# Patient Record
Sex: Female | Born: 1990
Health system: Southern US, Community
[De-identification: ages and names within clinical notes are randomized; demographics above are authoritative.]

## PROBLEM LIST (undated history)

## (undated) DIAGNOSIS — B2790 Infectious mononucleosis, unspecified without complication: Secondary | ICD-10-CM

## (undated) DIAGNOSIS — R7303 Prediabetes: Secondary | ICD-10-CM

## (undated) DIAGNOSIS — I10 Essential (primary) hypertension: Secondary | ICD-10-CM

## (undated) DIAGNOSIS — E119 Type 2 diabetes mellitus without complications: Secondary | ICD-10-CM

## (undated) DIAGNOSIS — F419 Anxiety disorder, unspecified: Secondary | ICD-10-CM

## (undated) DIAGNOSIS — E785 Hyperlipidemia, unspecified: Secondary | ICD-10-CM

## (undated) DIAGNOSIS — Z9889 Other specified postprocedural states: Secondary | ICD-10-CM

## (undated) HISTORY — DX: Type 2 diabetes mellitus without complications: E11.9

## (undated) HISTORY — DX: Essential (primary) hypertension: I10

## (undated) HISTORY — PX: CYSTECTOMY: SUR359

## (undated) HISTORY — PX: SALPINGECTOMY: SHX328

---
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
## 2014-07-11 ENCOUNTER — Encounter: Payer: Self-pay | Admitting: Emergency Medicine

## 2014-07-11 ENCOUNTER — Observation Stay
Admission: EM | Admit: 2014-07-11 | Discharge: 2014-07-14 | Disposition: A | Discharge status: Home / Self Care | Payer: BLUE CROSS/BLUE SHIELD | Attending: Internal Medicine | Admitting: Internal Medicine

## 2014-07-11 ENCOUNTER — Emergency Department: Payer: BLUE CROSS/BLUE SHIELD

## 2014-07-11 DIAGNOSIS — K760 Fatty (change of) liver, not elsewhere classified: Secondary | ICD-10-CM | POA: Diagnosis not present

## 2014-07-11 DIAGNOSIS — K920 Hematemesis: Secondary | ICD-10-CM | POA: Diagnosis present

## 2014-07-11 DIAGNOSIS — B3781 Candidal esophagitis: Secondary | ICD-10-CM | POA: Insufficient documentation

## 2014-07-11 DIAGNOSIS — K209 Esophagitis, unspecified: Secondary | ICD-10-CM | POA: Insufficient documentation

## 2014-07-11 DIAGNOSIS — R1084 Generalized abdominal pain: Secondary | ICD-10-CM | POA: Diagnosis not present

## 2014-07-11 DIAGNOSIS — Z794 Long term (current) use of insulin: Secondary | ICD-10-CM | POA: Insufficient documentation

## 2014-07-11 DIAGNOSIS — R350 Frequency of micturition: Secondary | ICD-10-CM | POA: Diagnosis not present

## 2014-07-11 DIAGNOSIS — Z79899 Other long term (current) drug therapy: Secondary | ICD-10-CM | POA: Diagnosis not present

## 2014-07-11 DIAGNOSIS — F419 Anxiety disorder, unspecified: Secondary | ICD-10-CM | POA: Diagnosis not present

## 2014-07-11 DIAGNOSIS — R102 Pelvic and perineal pain: Secondary | ICD-10-CM | POA: Insufficient documentation

## 2014-07-11 DIAGNOSIS — Z881 Allergy status to other antibiotic agents status: Secondary | ICD-10-CM | POA: Diagnosis not present

## 2014-07-11 DIAGNOSIS — Z91040 Latex allergy status: Secondary | ICD-10-CM | POA: Diagnosis not present

## 2014-07-11 DIAGNOSIS — Z880 Allergy status to penicillin: Secondary | ICD-10-CM | POA: Insufficient documentation

## 2014-07-11 DIAGNOSIS — R112 Nausea with vomiting, unspecified: Secondary | ICD-10-CM | POA: Diagnosis present

## 2014-07-11 DIAGNOSIS — K2970 Gastritis, unspecified, without bleeding: Secondary | ICD-10-CM | POA: Diagnosis not present

## 2014-07-11 DIAGNOSIS — R7303 Prediabetes: Secondary | ICD-10-CM | POA: Diagnosis present

## 2014-07-11 DIAGNOSIS — R7309 Other abnormal glucose: Secondary | ICD-10-CM | POA: Insufficient documentation

## 2014-07-11 DIAGNOSIS — R1110 Vomiting, unspecified: Secondary | ICD-10-CM

## 2014-07-11 HISTORY — DX: Anxiety disorder, unspecified: F41.9

## 2014-07-11 HISTORY — DX: Prediabetes: R73.03

## 2014-07-11 LAB — CBC WITH DIFFERENTIAL/PLATELET
BASOS PCT: 1 %
Basophils Absolute: 0.1 10*3/uL (ref 0–0.1)
EOS ABS: 0.1 10*3/uL (ref 0–0.7)
EOS PCT: 1 %
HCT: 40.9 % (ref 35.0–47.0)
Hemoglobin: 13.5 g/dL (ref 12.0–16.0)
Lymphocytes Relative: 23 %
Lymphs Abs: 2.6 10*3/uL (ref 1.0–3.6)
MCH: 29.3 pg (ref 26.0–34.0)
MCHC: 33 g/dL (ref 32.0–36.0)
MCV: 88.9 fL (ref 80.0–100.0)
MONOS PCT: 6 %
Monocytes Absolute: 0.6 10*3/uL (ref 0.2–0.9)
Neutro Abs: 7.6 10*3/uL — ABNORMAL HIGH (ref 1.4–6.5)
Neutrophils Relative %: 69 %
Platelets: 275 10*3/uL (ref 150–440)
RBC: 4.6 MIL/uL (ref 3.80–5.20)
RDW: 13.2 % (ref 11.5–14.5)
WBC: 11 10*3/uL (ref 3.6–11.0)

## 2014-07-11 LAB — COMPREHENSIVE METABOLIC PANEL
ALK PHOS: 83 U/L (ref 38–126)
ALT: 56 U/L — ABNORMAL HIGH (ref 14–54)
ANION GAP: 10 (ref 5–15)
AST: 58 U/L — ABNORMAL HIGH (ref 15–41)
Albumin: 4.2 g/dL (ref 3.5–5.0)
BUN: 9 mg/dL (ref 6–20)
CHLORIDE: 99 mmol/L — AB (ref 101–111)
CO2: 27 mmol/L (ref 22–32)
CREATININE: 0.53 mg/dL (ref 0.44–1.00)
Calcium: 9.3 mg/dL (ref 8.9–10.3)
Glucose, Bld: 140 mg/dL — ABNORMAL HIGH (ref 65–99)
POTASSIUM: 3.6 mmol/L (ref 3.5–5.1)
SODIUM: 136 mmol/L (ref 135–145)
Total Bilirubin: 0.5 mg/dL (ref 0.3–1.2)
Total Protein: 8.7 g/dL — ABNORMAL HIGH (ref 6.5–8.1)

## 2014-07-11 LAB — PREGNANCY, URINE: Preg Test, Ur: NEGATIVE

## 2014-07-11 MED ORDER — SODIUM CHLORIDE 0.9 % IV SOLN
Freq: Once | INTRAVENOUS | Status: AC
  Administered 2014-07-11: 18:00:00 via INTRAVENOUS

## 2014-07-11 MED ORDER — HYDROMORPHONE HCL 1 MG/ML IJ SOLN
INTRAMUSCULAR | Status: AC
  Administered 2014-07-11: 1 mg via INTRAVENOUS
  Filled 2014-07-11: qty 1

## 2014-07-11 MED ORDER — MORPHINE SULFATE 4 MG/ML IJ SOLN
4.0000 mg | Freq: Once | INTRAMUSCULAR | Status: AC
  Administered 2014-07-11: 4 mg via INTRAVENOUS

## 2014-07-11 MED ORDER — FAMOTIDINE IN NACL 20-0.9 MG/50ML-% IV SOLN
20.0000 mg | Freq: Once | INTRAVENOUS | Status: AC
  Administered 2014-07-11: 20 mg via INTRAVENOUS
  Filled 2014-07-11: qty 50

## 2014-07-11 MED ORDER — INSULIN ASPART 100 UNIT/ML ~~LOC~~ SOLN
0.0000 [IU] | Freq: Three times a day (TID) | SUBCUTANEOUS | Status: DC
  Administered 2014-07-12 (×2): 1 [IU] via SUBCUTANEOUS
  Administered 2014-07-12: 3 [IU] via SUBCUTANEOUS
  Filled 2014-07-11 (×2): qty 1
  Filled 2014-07-11: qty 3

## 2014-07-11 MED ORDER — ONDANSETRON HCL 4 MG/2ML IJ SOLN
4.0000 mg | Freq: Once | INTRAMUSCULAR | Status: AC
  Administered 2014-07-11: 4 mg via INTRAVENOUS

## 2014-07-11 MED ORDER — SERTRALINE HCL 50 MG PO TABS
50.0000 mg | ORAL_TABLET | Freq: Every day | ORAL | Status: DC
  Administered 2014-07-13 – 2014-07-14 (×2): 50 mg via ORAL
  Filled 2014-07-11 (×2): qty 1

## 2014-07-11 MED ORDER — METOCLOPRAMIDE HCL 5 MG/ML IJ SOLN
10.0000 mg | Freq: Once | INTRAMUSCULAR | Status: AC
  Administered 2014-07-11: 10 mg via INTRAVENOUS

## 2014-07-11 MED ORDER — ONDANSETRON HCL 4 MG PO TABS
4.0000 mg | ORAL_TABLET | Freq: Four times a day (QID) | ORAL | Status: DC | PRN
  Filled 2014-07-11: qty 1

## 2014-07-11 MED ORDER — ENOXAPARIN SODIUM 40 MG/0.4ML ~~LOC~~ SOLN
40.0000 mg | SUBCUTANEOUS | Status: DC
Start: 2014-07-11 — End: 2014-07-12
  Administered 2014-07-12: 40 mg via SUBCUTANEOUS
  Filled 2014-07-11: qty 0.4

## 2014-07-11 MED ORDER — MORPHINE SULFATE 4 MG/ML IJ SOLN
INTRAMUSCULAR | Status: AC
  Filled 2014-07-11: qty 1

## 2014-07-11 MED ORDER — MORPHINE SULFATE 2 MG/ML IJ SOLN
2.0000 mg | INTRAMUSCULAR | Status: DC | PRN
  Administered 2014-07-12 – 2014-07-13 (×3): 2 mg via INTRAVENOUS
  Filled 2014-07-11 (×3): qty 1

## 2014-07-11 MED ORDER — ONDANSETRON HCL 4 MG/2ML IJ SOLN
INTRAMUSCULAR | Status: AC
  Filled 2014-07-11: qty 2

## 2014-07-11 MED ORDER — FAMOTIDINE IN NACL 20-0.9 MG/50ML-% IV SOLN
INTRAVENOUS | Status: AC
  Filled 2014-07-11: qty 50

## 2014-07-11 MED ORDER — PROMETHAZINE HCL 25 MG/ML IJ SOLN: 12.5000 mg | Freq: Four times a day (QID) | INTRAMUSCULAR | Status: DC | PRN

## 2014-07-11 MED ORDER — IOHEXOL 300 MG/ML  SOLN
100.0000 mL | Freq: Once | INTRAMUSCULAR | Status: AC | PRN
  Administered 2014-07-11: 100 mL via INTRAVENOUS

## 2014-07-11 MED ORDER — ONDANSETRON HCL 4 MG/2ML IJ SOLN
4.0000 mg | Freq: Four times a day (QID) | INTRAMUSCULAR | Status: DC | PRN
  Administered 2014-07-12 – 2014-07-13 (×4): 4 mg via INTRAVENOUS
  Filled 2014-07-11 (×5): qty 2

## 2014-07-11 MED ORDER — HYDROMORPHONE HCL 1 MG/ML IJ SOLN
1.0000 mg | Freq: Once | INTRAMUSCULAR | Status: AC
  Administered 2014-07-11: 1 mg via INTRAVENOUS

## 2014-07-11 NOTE — ED Provider Notes (Signed)
[facility] Emergency Department Provider Note    Time seen: 1712  I have reviewed the triage vital signs and the nursing notes.   HISTORY  Chief Complaint Hematemesis and Emesis    HPI Annette Leon is a 24 y.o. female this ER for diarrhea for the last 24-48 hours. Patient reports that she vomited all night and she has vomited some blood initially just mixed with the vomiting, and then she started seeing some blood clots in the vomit. Complains of severe generalized abdominal pain, dull nothing makes it better vomiting makes it worse. Denies any history of same    History reviewed. No pertinent past medical history.  There are no active problems to display for this patient.   History reviewed. No pertinent past surgical history.  No current outpatient prescriptions on file.  Allergies Latex  History reviewed. No pertinent family history.  Social History History  Substance Use Topics  . Smoking status: Never Smoker   . Smokeless tobacco: Never Used  . Alcohol Use: Yes    Review of Systems Constitutional: Negative for fever. Eyes: Negative for visual changes. ENT: Negative for sore throat. Cardiovascular: Negative for chest pain. Respiratory: Negative for shortness of breath. Gastrointestinal: Positive for abdominal pain vomiting and diarrhea Genitourinary: Negative for dysuria. Musculoskeletal: Negative for back pain. Skin: Negative for rash. Neurological: Negative for headaches, focal weakness or numbness.  10-point ROS otherwise negative.  ____________________________________________   PHYSICAL EXAM:  VITAL SIGNS: ED Triage Vitals  Enc Vitals Group     BP 07/11/14 1709 169/88 mmHg     Pulse Rate 07/11/14 1709 86     Resp 07/11/14 1709 16     Temp 07/11/14 1709 97.7 F (36.5 C)     Temp Source 07/11/14 1709 Oral     SpO2 07/11/14 1709 96 %     Weight 07/11/14 1709 230 lb (104.327 kg)     Height 07/11/14 1709 5\' 9"   (1.753 m)     Head Cir --      Peak Flow --      Pain Score 07/11/14 1710 5     Pain Loc --      Pain Edu? --      Excl. in [place]? --     Constitutional: Alert and oriented. Well appearing and in no distress. Eyes: Conjunctivae are normal. PERRL. Normal extraocular movements. ENT   Head: Normocephalic and atraumatic.   Nose: No congestion/rhinnorhea.   Mouth/Throat: Mucous membranes are moist.   Neck: No stridor. Hematological/Lymphatic/Immunilogical: No cervical lymphadenopathy. Cardiovascular: Normal rate, regular rhythm. Normal and symmetric distal pulses are present in all extremities. No murmurs, rubs, or gallops. Respiratory: Normal respiratory effort without tachypnea nor retractions. Breath sounds are clear and equal bilaterally. No wheezes/rales/rhonchi. Gastrointestinal: Diffuse nonfocal abdominal tenderness. No rebound or guarding. Normal bowel sounds Musculoskeletal: Nontender with normal range of motion in all extremities. No joint effusions.  No lower extremity tenderness nor edema. Neurologic:  Normal speech and language. No gross focal neurologic deficits are appreciated. Speech is normal. No gait instability. Skin:  Skin is warm, dry and intact. No rash noted. Psychiatric: Mood and affect are normal. Speech and behavior are normal. Patient exhibits appropriate insight and judgment.  ____________________________________________    LABS (pertinent positives/negatives)  Labs Reviewed  CBC WITH DIFFERENTIAL/PLATELET - Abnormal; Notable for the following:    Neutro Abs 7.6 (*)    All other components within normal limits  COMPREHENSIVE METABOLIC PANEL - Abnormal; Notable for the following:  Chloride 99 (*)    Glucose, Bld 140 (*)    Total Protein 8.7 (*)    AST 58 (*)    ALT 56 (*)    All other components within normal limits  PREGNANCY, URINE  LIPASE, BLOOD  URINALYSIS COMPLETEWITH MICROSCOPIC ([facility])       ____________________________________________    RADIOLOGY  FINDINGS: Lower chest: Unremarkable  Hepatobiliary: Hepatic steatosis identified. No other hepatic abnormality identified. The gallbladder is unremarkable. There is no evidence of biliary dilatation.  Pancreas: Unremarkable  Spleen: Unremarkable  Adrenals/Urinary Tract: The kidneys, adrenal glands and bladder are unremarkable.  Stomach/Bowel: Unremarkable. There is no evidence of bowel obstruction or bowel wall thickening. The appendix is normal.  Vascular/Lymphatic: No enlarged lymph nodes or vascular abnormality.  Reproductive: The uterus and adnexal regions are unremarkable.  Other: No free fluid, abscess or pneumoperitoneum.  Musculoskeletal: No acute or suspicious abnormalities.  IMPRESSION: Hepatic steatosis without other significant abnormality.  ____________________________________________    ED COURSE  Pertinent labs & imaging results that were available during my care of the patient were reviewed by me and considered in my medical decision making (see chart for details).  IV fluids, antiemetics, IV Pepcid. We'll reevaluate  FINAL ASSESSMENT AND PLAN  Gastroenteritis and intractable nausea and vomiting  Plan: Patient has been unable to tolerate by mouth after 3 different doses of antiemetics. Patient was has received IV Zofran, IV Phenergan, IV Reglan. Patient has not improved after each of these, and has continued to vomit here. Advised since she is unable tolerate by mouth she will need hospitalization and IV hydration.  Earleen Newport, MD   Earleen Newport, MD 07/11/14 [id number]

## 2014-07-11 NOTE — ED Notes (Signed)
Dr. Jimmye Norman in to reevaluate pt and encourage pt to take additional po ice chips. Pt immediately vomited after 1 ice chip. md notified.

## 2014-07-11 NOTE — ED Notes (Signed)
Tech brandon in to check on pt who states "she's fine".

## 2014-07-11 NOTE — ED Notes (Signed)
Patient to ED with c.o abdominal pain and nausea and vomiting. Patient states that vomiting started yesterday. Patient states she has noted blood in the vomit. Patient states she has also noted clots " dime sized". Patient states abdominal pain started today. Patient is alert and oriented.

## 2014-07-11 NOTE — ED Notes (Signed)
Pt unable to tolerate ice chips. Pt to ct scan

## 2014-07-11 NOTE — H&P (Signed)
Annette Leon NAME: Pleasant Britz    MR#:  [id number]  DATE OF BIRTH:  06/10/1990  DATE OF ADMISSION:  07/11/2014  PRIMARY CARE PHYSICIAN: Chad Cordial, PA-C   REQUESTING/REFERRING PHYSICIAN: Williams  CHIEF COMPLAINT:   Chief Complaint  Patient presents with  . Hematemesis  . Emesis    HISTORY OF PRESENT ILLNESS:  Annette Leon  is a 24 y.o. female who presents with 1 day of intractable nausea and vomiting, with some hematemesis. Patient states that she began to have some generalized abdominal pain, worst in the left upper quadrant and epigastric region. This began just over day ago, concurrently with nausea and vomiting. She tried some antiemetics by mouth at home, without much relief. A brief time after she began vomiting, she noticed that she vomited up some amount of blood in her emesis, and subsequent to that vomited up some clots while she was at an urgent care visit. At that point she was urged to come into the ED. She denies any strong infectious symptoms other then a fever one day ago to 101 along with some chills, and some mild urinary frequency without any true dysuria. She did also begin to have a little bit of diarrhea the day of admission. In the ED was found to have generally benign labs, urine pregnancy test negative, CT abdomen and pelvis showed only some hepatic steatosis, without any pancreatic or gallbladder disease. UA pending at this time, but hospitalists were called for admission due to the hematemesis and intractable nausea and vomiting.  PAST MEDICAL HISTORY:   Past Medical History  Diagnosis Date  . Anxiety   . Pre-diabetes     PAST SURGICAL HISTORY:   Past Surgical History  Procedure Laterality Date  . Salpingectomy      partial  . Cystectomy      BL fallopian tube cysts    SOCIAL HISTORY:   History  Substance Use Topics  . Smoking status: Never Smoker   . Smokeless tobacco:  Never Used  . Alcohol Use: 0.0 oz/week    0 Standard drinks or equivalent per week     Comment: social drinker    FAMILY HISTORY:   Family History  Problem Relation Age of Onset  . Diabetes      pre-diabetic grandfather    DRUG ALLERGIES:   Allergies  Allergen Reactions  . Latex Anaphylaxis  . Amoxicillin Hives  . Penicillins Hives    MEDICATIONS AT HOME:   Prior to Admission medications   Medication Sig Start Date End Date Taking? Authorizing Provider  metFORMIN (GLUCOPHAGE-XR) 500 MG 24 hr tablet Take 500 mg by mouth 2 (two) times daily. 06/08/14  Yes Historical Provider, MD  Norethin-Eth Estradiol-Fe [facility] FE,WYMZYA Orrin Brigham) 0.4-35 MG-MCG tablet Chew 1 tablet by mouth daily.   Yes Historical Provider, MD  ondansetron (ZOFRAN-ODT) 8 MG disintegrating tablet Take 1 tablet by mouth 3 (three) times daily as needed. For symptoms. 04/16/14  Yes Historical Provider, MD  sertraline (ZOLOFT) 50 MG tablet Take 50 mg by mouth.   Yes Historical Provider, MD    REVIEW OF SYSTEMS:  Review of Systems  Constitutional: Positive for fever and chills. Negative for weight loss and malaise/fatigue.  HENT: Negative for ear pain, hearing loss and tinnitus.   Eyes: Negative for blurred vision, double vision, pain and redness.  Respiratory: Negative for cough, hemoptysis and shortness of breath.   Cardiovascular: Negative for chest pain, palpitations,  orthopnea and leg swelling.  Gastrointestinal: Positive for nausea, vomiting, abdominal pain and diarrhea. Negative for constipation.  Genitourinary: Negative for dysuria, frequency and hematuria.  Musculoskeletal: Negative for back pain, joint pain and neck pain.  Skin:       No acne, rash, or lesions  Neurological: Negative for dizziness, tremors, focal weakness and weakness.  Endo/Heme/Allergies: Negative for polydipsia. Does not bruise/bleed easily.  Psychiatric/Behavioral: Negative for depression. The patient is not  nervous/anxious and does not have insomnia.      VITAL SIGNS:   Filed Vitals:   07/11/14 1830 07/11/14 1900 07/11/14 1930 07/11/14 2121  BP: 130/88 129/80 127/94 127/87  Pulse: 73 64 66 74  Temp:    98.5 F (36.9 C)  TempSrc:    Oral  Resp:    16  Height:      Weight:      SpO2: 98% 100% 98% 98%   Wt Readings from Last 3 Encounters:  07/11/14 104.327 kg (230 lb)    PHYSICAL EXAMINATION:  Physical Exam  Constitutional: She is oriented to person, place, and time. She appears well-developed and well-nourished. No distress.  HENT:  Head: Normocephalic and atraumatic.  Mouth/Throat: Oropharynx is clear and moist.  Eyes: Conjunctivae and EOM are normal. Pupils are equal, round, and reactive to light. No scleral icterus.  Neck: Normal range of motion. Neck supple. No JVD present. No thyromegaly present.  Cardiovascular: Normal rate, regular rhythm and intact distal pulses.  Exam reveals no gallop and no friction rub.   No murmur heard. Respiratory: Effort normal and breath sounds normal. No respiratory distress. She has no wheezes. She has no rales.  GI: Soft. She exhibits no distension. There is tenderness.  Bowel sounds are hypoactive  Musculoskeletal: Normal range of motion. She exhibits no edema.  No arthritis, no gout  Lymphadenopathy:    She has no cervical adenopathy.  Neurological: She is alert and oriented to person, place, and time. No cranial nerve deficit.  No dysarthria, no aphasia  Skin: Skin is warm and dry. No rash noted. No erythema.  Psychiatric: She has a normal mood and affect. Her behavior is normal. Judgment and thought content normal.    LABORATORY PANEL:   CBC  Recent Labs Lab 07/11/14 1725  WBC 11.0  HGB 13.5  HCT 40.9  PLT 275   ------------------------------------------------------------------------------------------------------------------  Chemistries   Recent Labs Lab 07/11/14 1725  NA 136  K 3.6  CL 99*  CO2 27  GLUCOSE 140*   BUN 9  CREATININE 0.53  CALCIUM 9.3  AST 58*  ALT 56*  ALKPHOS 83  BILITOT 0.5   ------------------------------------------------------------------------------------------------------------------  Cardiac Enzymes No results for input(s): TROPONINI in the last 168 hours. ------------------------------------------------------------------------------------------------------------------  RADIOLOGY:  Ct Abdomen Pelvis W Contrast  07/11/2014   CLINICAL DATA:  Abdominal and pelvic pain with nausea and vomiting for 1 day.  EXAM: CT ABDOMEN AND PELVIS WITH CONTRAST  TECHNIQUE: Multidetector CT imaging of the abdomen and pelvis was performed using the standard protocol following bolus administration of intravenous contrast.  CONTRAST:  163mL OMNIPAQUE IOHEXOL 300 MG/ML  SOLN  COMPARISON:  None.  FINDINGS: Lower chest:  Unremarkable  Hepatobiliary: Hepatic steatosis identified. No other hepatic abnormality identified. The gallbladder is unremarkable. There is no evidence of biliary dilatation.  Pancreas: Unremarkable  Spleen: Unremarkable  Adrenals/Urinary Tract: The kidneys, adrenal glands and bladder are unremarkable.  Stomach/Bowel: Unremarkable. There is no evidence of bowel obstruction or bowel wall thickening. The appendix is normal.  Vascular/Lymphatic:  No enlarged lymph nodes or vascular abnormality.  Reproductive: The uterus and adnexal regions are unremarkable.  Other: No free fluid, abscess or pneumoperitoneum.  Musculoskeletal: No acute or suspicious abnormalities.  IMPRESSION: Hepatic steatosis without other significant abnormality.   Electronically Signed   By: Margarette Canada M.D.   On: 07/11/2014 21:06    EKG:  No orders found for this or any previous visit.  IMPRESSION AND PLAN:  Principal Problem:   Hematemesis - patient is no longer throwing up clots or blood in her emesis. However her history is concerning for something like a gastritis or perhaps peptic ulcer disease. We will admit  her tonight for observation, try to control her nausea and vomiting as below, and get a GI consult in the morning. Active Problems:   Intractable nausea and vomiting - antiemetics in the ED help calm her nausea and vomiting as long as she is not trying to take in anything by mouth. We will keep these on board, and encourage by mouth intake as tolerated to advance her diet as tolerated.   Urinary frequency - UA pending, will review this once completed. If suggestive of UTI we'll send culture and start antibiotics.   Anxiety - stable problem, continue home dose of Zoloft.   Pre-diabetes - controlled with metformin by mouth at home, we will hold her metformin for now as it could contribute to her nausea, we will have her on sliding scale insulin for insulin sensitive patients while here.   All the records are reviewed and case discussed with ED provider. Management plans discussed with the patient and/or family.  DVT PROPHYLAXIS: SubQ lovenox  ADMISSION STATUS: Observation  CODE STATUS: Full  TOTAL TIME TAKING CARE OF THIS PATIENT: 45 minutes.    Feliberto Stockley [place] 07/11/2014, 10:26 PM  [employer] [facility]  Office  [phone]  CC: Primary care physician; Chad Cordial, PA-C

## 2014-07-11 NOTE — ED Notes (Signed)
Patient to ED with report of vomiting for about 24 hours, reports some time during the night she began vomiting blood and has vomited up a few clots recently.

## 2014-07-11 NOTE — ED Notes (Signed)
Pt returned from ct scan. Warm blankets provided to pt's visitors on request.

## 2014-07-11 NOTE — ED Notes (Signed)
Pt up to restroom to void. Pt states pain level of 3/10 but "my stomach is starting to hurt again." skin pwd. Ice chips provided for po challenge.

## 2014-07-12 ENCOUNTER — Encounter: Payer: Self-pay | Admitting: Gastroenterology

## 2014-07-12 LAB — GLUCOSE, CAPILLARY
GLUCOSE-CAPILLARY: 135 mg/dL — AB (ref 65–99)
GLUCOSE-CAPILLARY: 201 mg/dL — AB (ref 65–99)
GLUCOSE-CAPILLARY: 181 mg/dL — AB (ref 65–99)
Glucose-Capillary: 132 mg/dL — ABNORMAL HIGH (ref 65–99)

## 2014-07-12 LAB — CBC
HCT: 34.9 % — ABNORMAL LOW (ref 35.0–47.0)
HEMOGLOBIN: 11.9 g/dL — AB (ref 12.0–16.0)
MCH: 30.4 pg (ref 26.0–34.0)
MCHC: 34.1 g/dL (ref 32.0–36.0)
MCV: 89.2 fL (ref 80.0–100.0)
PLATELETS: 234 10*3/uL (ref 150–440)
RBC: 3.91 MIL/uL (ref 3.80–5.20)
RDW: 13.2 % (ref 11.5–14.5)
WBC: 12.5 10*3/uL — ABNORMAL HIGH (ref 3.6–11.0)

## 2014-07-12 LAB — URINALYSIS COMPLETE WITH MICROSCOPIC (ARMC ONLY)
Bilirubin Urine: NEGATIVE
GLUCOSE, UA: NEGATIVE mg/dL
Hgb urine dipstick: NEGATIVE
Ketones, ur: NEGATIVE mg/dL
LEUKOCYTES UA: NEGATIVE
Nitrite: NEGATIVE
PROTEIN: NEGATIVE mg/dL
pH: 5 (ref 5.0–8.0)

## 2014-07-12 LAB — BASIC METABOLIC PANEL
Anion gap: 7 (ref 5–15)
BUN: 8 mg/dL (ref 6–20)
CO2: 27 mmol/L (ref 22–32)
CREATININE: 0.53 mg/dL (ref 0.44–1.00)
Calcium: 8.7 mg/dL — ABNORMAL LOW (ref 8.9–10.3)
Chloride: 105 mmol/L (ref 101–111)
GFR calc non Af Amer: >60 mL/min (ref >60)
Glucose, Bld: 130 mg/dL — ABNORMAL HIGH (ref 65–99)
POTASSIUM: 3.7 mmol/L (ref 3.5–5.1)
Sodium: 139 mmol/L (ref 135–145)

## 2014-07-12 LAB — PROTIME-INR
INR: 1.11
Prothrombin Time: 14.5 seconds (ref 11.4–15.0)

## 2014-07-12 LAB — LIPASE, BLOOD: Lipase: 24 U/L (ref 22–51)

## 2014-07-12 MED ORDER — HYDROCODONE-ACETAMINOPHEN 5-325 MG PO TABS
1.0000 | ORAL_TABLET | Freq: Four times a day (QID) | ORAL | Status: DC | PRN
  Administered 2014-07-12: 1 via ORAL
  Filled 2014-07-12: qty 1

## 2014-07-12 MED ORDER — ACETAMINOPHEN 325 MG PO TABS
650.0000 mg | ORAL_TABLET | Freq: Four times a day (QID) | ORAL | Status: DC | PRN
  Administered 2014-07-12 – 2014-07-13 (×2): 650 mg via ORAL
  Filled 2014-07-12: qty 2

## 2014-07-12 MED ORDER — ACETAMINOPHEN 325 MG PO TABS
ORAL_TABLET | ORAL | Status: AC
  Filled 2014-07-12: qty 2

## 2014-07-12 NOTE — Consult Note (Signed)
GI Inpatient Consult Note  Reason for Consult: Hemetemesis   Attending Requesting Consult: Dr. Manuella Ghazi  History of Present Illness: Annette Leon is a 24 y.o. female with hemetemesis. Indigestion after eating for 1 week. Antacids without relief. Started having f/c/n/v, followed by gross blood in her vomitus. Vomiting has stopped since admission. CT neg except for fatty liver. No recent NSAIDS use. NO family members affected. On protonix now. Some drop in hgb since admission.   Past Medical History:  Past Medical History  Diagnosis Date  . Anxiety   . Pre-diabetes     Problem List: Patient Active Problem List   Diagnosis Date Noted  . Hematemesis 07/11/2014  . Intractable nausea and vomiting 07/11/2014  . Urinary frequency 07/11/2014  . Anxiety 07/11/2014  . Pre-diabetes 07/11/2014    Past Surgical History: Past Surgical History  Procedure Laterality Date  . Salpingectomy      partial  . Cystectomy      BL fallopian tube cysts    Allergies: Allergies  Allergen Reactions  . Latex Anaphylaxis  . Amoxicillin Hives  . Penicillins Hives    Home Medications: Prescriptions prior to admission  Medication Sig Dispense Refill Last Dose  . metFORMIN (GLUCOPHAGE-XR) 500 MG 24 hr tablet Take 500 mg by mouth 2 (two) times daily.   07/10/2014 at Unknown time  . Norethin-Eth Estradiol-Fe [facility] FE,WYMZYA Orrin Brigham) 0.4-35 MG-MCG tablet Chew 1 tablet by mouth daily.     . ondansetron (ZOFRAN-ODT) 8 MG disintegrating tablet Take 1 tablet by mouth 3 (three) times daily as needed. For symptoms.  0 Past Month at Unknown time  . sertraline (ZOLOFT) 50 MG tablet Take 50 mg by mouth.   07/10/2014   Home medication reconciliation was completed with the patient.   Scheduled Inpatient Medications:   . insulin aspart  0-9 Units Subcutaneous TID WC  . sertraline  50 mg Oral Daily    Continuous Inpatient Infusions:     PRN Inpatient Medications:  morphine injection,  ondansetron **OR** ondansetron (ZOFRAN) IV, promethazine  Family History: family history includes Diabetes in an other family member.  The patient's family history is negative for inflammatory bowel disorders, GI malignancy, or solid organ transplantation.  Social History:   reports that she has never smoked. She has never used smokeless tobacco. She reports that she drinks alcohol. She reports that she does not use illicit drugs. The patient denies ETOH, tobacco, or drug use.   Review of Systems: Constitutional: Weight is stable. Some fever/chills at home. Eyes: No changes in vision. ENT: No oral lesions, sore throat.  GI: see HPI.  Heme/Lymph: No easy bruising.  CV: No chest pain.  GU: No hematuria.  Integumentary: No rashes.  Neuro: No headaches.  Psych: No depression/anxiety.  Endocrine: No heat/cold intolerance.  Allergic/Immunologic: No urticaria.  Resp: No cough, SOB.  Musculoskeletal: No joint swelling.    Physical Examination: BP 117/68 mmHg  Pulse 57  Temp(Src) 98 F (36.7 C) (Oral)  Resp 18  Ht 5\' 9"  (1.753 m)  Wt 106.323 kg (234 lb 6.4 oz)  BMI 34.60 kg/m2  SpO2 99%  LMP 06/12/2014 Gen: NAD, alert and oriented x 4 HEENT: PEERLA, EOMI, Neck: supple, no JVD or thyromegaly Chest: CTA bilaterally, no wheezes, crackles, or other adventitious sounds CV: RRR, no m/g/c/r Abd: soft, epigastric tenderness, ND, +BS in all four quadrants; no HSM, guarding, ridigity, or rebound tenderness Ext: no edema, well perfused with 2+ pulses, Skin: no rash or lesions noted Lymph: no  LAD  Data: Lab Results  Component Value Date   WBC 12.5* 07/12/2014   HGB 11.9* 07/12/2014   HCT 34.9* 07/12/2014   MCV 89.2 07/12/2014   PLT 234 07/12/2014    Recent Labs Lab 07/11/14 1725 07/12/14 0455  HGB 13.5 11.9*   Lab Results  Component Value Date   NA 139 07/12/2014   K 3.7 07/12/2014   CL 105 07/12/2014   CO2 27 07/12/2014   BUN 8 07/12/2014   CREATININE 0.53 07/12/2014    Lab Results  Component Value Date   ALT 56* 07/11/2014   AST 58* 07/11/2014   ALKPHOS 83 07/11/2014   BILITOT 0.5 07/11/2014    Recent Labs Lab 07/12/14 0455  INR 1.11   Assessment/Plan: Ms. Ledin is a 24 y.o. female with hemetemesis. May have gastritis/PUD. Need to check for M-W tear from retching as well.  Recommendations: Continue PPI. Clear liquid diet rest of today but NPO after MN. Plan EGD tomorrow.  Thank you for the consult. Please call with questions or concerns.  Caramia Boutin, Lupita Dawn, MD

## 2014-07-12 NOTE — Progress Notes (Signed)
[facility] [place] NAME: Annette Leon    MR#:  [id number]  DATE OF BIRTH:  10-Dec-1990  SUBJECTIVE:  Still somewhat nauseous, no vomiting. Mother at bedside  REVIEW OF SYSTEMS:  Review of Systems  Constitutional: Negative for fever, chills and weight loss.  HENT: Negative for nosebleeds and sore throat.   Eyes: Negative for blurred vision.  Respiratory: Negative for cough, shortness of breath and wheezing.   Cardiovascular: Negative for chest pain, orthopnea, leg swelling and PND.  Gastrointestinal: Positive for nausea and abdominal pain. Negative for heartburn, vomiting, diarrhea and constipation.  Genitourinary: Negative for dysuria and urgency.  Musculoskeletal: Negative for back pain.  Skin: Negative for rash.  Neurological: Negative for dizziness, speech change, focal weakness and headaches.  Endo/Heme/Allergies: Does not bruise/bleed easily.  Psychiatric/Behavioral: Negative for depression.    DRUG ALLERGIES:   Allergies  Allergen Reactions  . Latex Anaphylaxis  . Amoxicillin Hives  . Penicillins Hives    VITALS:  Blood pressure 117/68, pulse 57, temperature 98 F (36.7 C), temperature source Oral, resp. rate 18, height 5\' 9"  (1.753 m), weight 106.323 kg (234 lb 6.4 oz), last menstrual period 06/12/2014, SpO2 99 %.  PHYSICAL EXAMINATION:  GENERAL:  24 y.o.-year-old patient lying in the bed with no acute distress.  EYES: Pupils equal, round, reactive to light and accommodation. No scleral icterus. Extraocular muscles intact.  HEENT: Head atraumatic, normocephalic. Oropharynx and nasopharynx clear.  NECK:  Supple, no jugular venous distention. No thyroid enlargement, no tenderness.  LUNGS: Normal breath sounds bilaterally, no wheezing, rales,rhonchi or crepitation. No use of accessory muscles of respiration.  CARDIOVASCULAR: S1, S2 normal. No murmurs, rubs, or gallops.  ABDOMEN: Minimal epigastric tenderness,  nondistended. Bowel sounds present. No organomegaly or mass.  EXTREMITIES: No pedal edema, cyanosis, or clubbing.  NEUROLOGIC: Cranial nerves II through XII are intact. Muscle strength 5/5 in all extremities. Sensation intact. Gait not checked.  PSYCHIATRIC: The patient is alert and oriented x 3.  SKIN: No obvious rash, lesion, or ulcer.    LABORATORY PANEL:   CBC  Recent Labs Lab 07/12/14 0455  WBC 12.5*  HGB 11.9*  HCT 34.9*  PLT 234   ------------------------------------------------------------------------------------------------------------------  Chemistries   Recent Labs Lab 07/11/14 1725 07/12/14 0455  NA 136 139  K 3.6 3.7  CL 99* 105  CO2 27 27  GLUCOSE 140* 130*  BUN 9 8  CREATININE 0.53 0.53  CALCIUM 9.3 8.7*  AST 58*  --   ALT 56*  --   ALKPHOS 83  --   BILITOT 0.5  --    -----------------------------------------------------------------------------------------------------------------  RADIOLOGY:  Ct Abdomen Pelvis W Contrast  07/11/2014   CLINICAL DATA:  Abdominal and pelvic pain with nausea and vomiting for 1 day.  EXAM: CT ABDOMEN AND PELVIS WITH CONTRAST  TECHNIQUE: Multidetector CT imaging of the abdomen and pelvis was performed using the standard protocol following bolus administration of intravenous contrast.  CONTRAST:  191mL OMNIPAQUE IOHEXOL 300 MG/ML  SOLN  COMPARISON:  None.  FINDINGS: Lower chest:  Unremarkable  Hepatobiliary: Hepatic steatosis identified. No other hepatic abnormality identified. The gallbladder is unremarkable. There is no evidence of biliary dilatation.  Pancreas: Unremarkable  Spleen: Unremarkable  Adrenals/Urinary Tract: The kidneys, adrenal glands and bladder are unremarkable.  Stomach/Bowel: Unremarkable. There is no evidence of bowel obstruction or bowel wall thickening. The appendix is normal.  Vascular/Lymphatic: No enlarged lymph nodes or vascular abnormality.  Reproductive: The uterus and adnexal regions  are unremarkable.   Other: No free fluid, abscess or pneumoperitoneum.  Musculoskeletal: No acute or suspicious abnormalities.  IMPRESSION: Hepatic steatosis without other significant abnormality.   Electronically Signed   By: Margarette Canada M.D.   On: 07/11/2014 21:06     ASSESSMENT AND PLAN:   * Hematemesis - No longer throwing up clots or blood in her emesis. However her history is concerning for something like a gastritis or perhaps peptic ulcer disease. await GI consult in the morning. Discussed with Dr. Candace Cruise  * Intractable nausea and vomiting - no more vomiting but still having nausea. Start clear liquid diet and advance as tolerated  * Urinary frequency - UA is negative. No need of antibiotics  * Anxiety - stable problem, continue home dose of Zoloft.  * Pre-diabetes - controlled with metformin by mouth at home, we will hold her metformin for now as it could contribute to her nausea, we will have her on sliding scale insulin for insulin sensitive patients while here.    All the records are reviewed and case discussed with Care Management/Social Worker. Management plans discussed with the patient, family and they are in agreement.  CODE STATUS: Full code  TOTAL TIME TAKING CARE OF THIS PATIENT: 35 minutes.   POSSIBLE D/C IN 1-2 DAYS, DEPENDING ON CLINICAL CONDITION.   [facility], Dez Stauffer M.D on 07/12/2014 at 9:42 AM  Between 7am to 6pm - Pager - [phone]  After 6pm go to [email or web address] - password [email or web address] [facility]  Office  [phone]  CC: Primary care physician; Chad Cordial, PA-C

## 2014-07-12 NOTE — Progress Notes (Signed)
Patient is alert and oriented. Reporting tolerable pain control and improvement in nausea with PRN medication given today. GI consulted today, has seen patient with plan for procedure tomorrow. Patient resting quietly between care. Up to BR to void independently. Vomiting x 1 this shift. Will continue to monitor.

## 2014-07-13 ENCOUNTER — Encounter: Payer: Self-pay | Admitting: Gastroenterology

## 2014-07-13 ENCOUNTER — Encounter
Admission: EM | Disposition: A | Discharge status: Home / Self Care | Payer: Self-pay | Source: Home / Self Care | Attending: Emergency Medicine

## 2014-07-13 ENCOUNTER — Observation Stay: Payer: BLUE CROSS/BLUE SHIELD | Admitting: Anesthesiology

## 2014-07-13 HISTORY — PX: ESOPHAGOGASTRODUODENOSCOPY: SHX5428

## 2014-07-13 LAB — GLUCOSE, CAPILLARY
Glucose-Capillary: 113 mg/dL — ABNORMAL HIGH (ref 65–99)
Glucose-Capillary: 145 mg/dL — ABNORMAL HIGH (ref 65–99)
Glucose-Capillary: 162 mg/dL — ABNORMAL HIGH (ref 65–99)
Glucose-Capillary: 96 mg/dL (ref 65–99)

## 2014-07-13 SURGERY — EGD (ESOPHAGOGASTRODUODENOSCOPY)
Anesthesia: Monitor Anesthesia Care | Laterality: Left

## 2014-07-13 SURGERY — EGD (ESOPHAGOGASTRODUODENOSCOPY)
Anesthesia: General

## 2014-07-13 MED ORDER — PROPOFOL 10 MG/ML IV BOLUS
INTRAVENOUS | Status: DC | PRN
  Administered 2014-07-13: 50 mg via INTRAVENOUS

## 2014-07-13 MED ORDER — SODIUM CHLORIDE 0.9 % IV SOLN
INTRAVENOUS | Status: DC | PRN
  Administered 2014-07-13: 12:00:00 via INTRAVENOUS

## 2014-07-13 MED ORDER — PROPOFOL INFUSION 10 MG/ML OPTIME
INTRAVENOUS | Status: DC | PRN
  Administered 2014-07-13: 140 ug/kg/min via INTRAVENOUS

## 2014-07-13 MED ORDER — SODIUM CHLORIDE 0.9 % IV SOLN
INTRAVENOUS | Status: DC
  Administered 2014-07-13: 02:00:00 via INTRAVENOUS

## 2014-07-13 MED ORDER — MIDAZOLAM HCL 2 MG/2ML IJ SOLN
INTRAMUSCULAR | Status: DC | PRN
  Administered 2014-07-13: 1 mg via INTRAVENOUS

## 2014-07-13 MED ORDER — FENTANYL CITRATE (PF) 100 MCG/2ML IJ SOLN
INTRAMUSCULAR | Status: DC | PRN
  Administered 2014-07-13: 100 ug via INTRAVENOUS

## 2014-07-13 MED ORDER — SODIUM CHLORIDE 0.9 % IJ SOLN
3.0000 mL | Freq: Three times a day (TID) | INTRAMUSCULAR | Status: DC
  Administered 2014-07-13: 3 mL via INTRAVENOUS

## 2014-07-13 MED ORDER — GLYCOPYRROLATE 0.2 MG/ML IJ SOLN
INTRAMUSCULAR | Status: DC | PRN
  Administered 2014-07-13: .1 mg via INTRAVENOUS

## 2014-07-13 MED ORDER — PANTOPRAZOLE SODIUM 40 MG PO TBEC
40.0000 mg | DELAYED_RELEASE_TABLET | Freq: Two times a day (BID) | ORAL | Status: DC
Start: 2014-07-13 — End: 2014-07-14
  Administered 2014-07-13 – 2014-07-14 (×3): 40 mg via ORAL
  Filled 2014-07-13 (×3): qty 1

## 2014-07-13 NOTE — Progress Notes (Signed)
Pt improved advanced to soft diet tolerating diet . No complaints voiced

## 2014-07-13 NOTE — Anesthesia Preprocedure Evaluation (Signed)
Anesthesia Evaluation  Patient identified by MRN, date of birth, ID band Patient awake    Reviewed: Allergy & Precautions, NPO status , Patient's Chart, lab work & pertinent test results  Airway Mallampati: II  TM Distance: >3 FB Neck ROM: Full    Dental  (+) Teeth Intact   Pulmonary          Cardiovascular     Neuro/Psych    GI/Hepatic   Endo/Other  diabetes (Diet controlled), Well Controlled, Type 2  Renal/GU      Musculoskeletal   Abdominal   Peds  Hematology   Anesthesia Other Findings   Reproductive/Obstetrics                             Anesthesia Physical Anesthesia Plan  ASA: II  Anesthesia Plan: General   Post-op Pain Management:    Induction: Intravenous  Airway Management Planned: Nasal Cannula  Additional Equipment:   Intra-op Plan:   Post-operative Plan:   Informed Consent: I have reviewed the patients History and Physical, chart, labs and discussed the procedure including the risks, benefits and alternatives for the proposed anesthesia with the patient or authorized representative who has indicated his/her understanding and acceptance.     Plan Discussed with:   Anesthesia Plan Comments:         Anesthesia Quick Evaluation

## 2014-07-13 NOTE — Op Note (Signed)
[facility] Patient Name: Annette Leon Procedure Date: 07/13/2014 12:23 PM MRN: [id number] Account #: [id number] Date of Birth: 12/03/1990 Admit Type: Inpatient Age: 24 Room: [facility] ENDO ROOM 4 Gender: Female Note Status: Finalized Procedure:         Upper GI endoscopy Indications:       Hematemesis Providers:         Lupita Dawn. Candace Cruise, MD Referring MD:      Kincius C. Copland Jaclyn Prime, MD (Referring MD) Medicines:         Monitored Anesthesia Care Complications:     No immediate complications. Procedure:         Pre-Anesthesia Assessment:                    - Prior to the procedure, a History and Physical was                     performed, and patient medications, allergies and                     sensitivities were reviewed. The patient's tolerance of                     previous anesthesia was reviewed.                    - The risks and benefits of the procedure and the sedation                     options and risks were discussed with the patient. All                     questions were answered and informed consent was obtained.                    - After reviewing the risks and benefits, the patient was                     deemed in satisfactory condition to undergo the procedure.                    After obtaining informed consent, the endoscope was passed                     under direct vision. Throughout the procedure, the                     patient's blood pressure, pulse, and oxygen saturations                     were monitored continuously. The Endoscope was introduced                     through the mouth, and advanced to the second part of                     duodenum. The upper GI endoscopy was accomplished without                     difficulty. The patient tolerated the procedure well. Findings:      Mildly severe esophagitis with no bleeding was found in the lower third       of the esophagus. Cells for cytology were obtained by  brushing.  The exam was otherwise without abnormality.      Scattered mild inflammation characterized by erosions and erythema was       found in the gastric body. Biopsies were taken with a cold forceps for       Helicobacter pylori testing.      The exam was otherwise without abnormality.      The examined duodenum was normal. Impression:        - Mildly severe candidiasis esophagitis. Cells for                     cytology obtained.                    - The examination was otherwise normal.                    - Gastritis. Biopsied.                    - The examination was otherwise normal.                    - Normal examined duodenum. Recommendation:    - Observe patient's clinical course.                    - Continue present medications.                    - Await pathology results.                    - The findings and recommendations were discussed with the                     patient's family. Procedure Code(s): --- Professional ---                    [phone], Esophagogastroduodenoscopy, flexible, transoral;                     with biopsy, single or multiple Diagnosis Code(s): --- Professional ---                    B37.81, Candidal esophagitis                    K29.70, Gastritis, unspecified, without bleeding                    K92.0, Hematemesis CPT copyright 2014 American Medical Association. All rights reserved. The codes documented in this report are preliminary and upon coder review may  be revised to meet current compliance requirements. Hulen Luster, MD 07/13/2014 12:49:39 PM This report has been signed electronically. Number of Addenda: 0 Note Initiated On: 07/13/2014 12:23 PM      [facility]

## 2014-07-13 NOTE — Care Management Note (Signed)
Case Management Note  Patient Details  Name: Annette Leon MRN: [id number] Date of Birth: 01-14-1991  Subjective/Objective:                   Patient was independently returning from bathroom visit; mother at bedside. Patient states PCP is her GYN. She denies difficultly paying for medications or difficulty with transpotration. Per RN EGD pending today.  Action/Plan: No anticipated RNCM needs.   Expected Discharge Date:                  Expected Discharge Plan:     In-House Referral:     Discharge planning Services  CM Consult  Post Acute Care Choice:    Choice offered to:  NA  DME Arranged:    DME Agency:     HH Arranged:    [place] Agency:     Status of Service:     Medicare Important Message Given:    Date Medicare IM Given:    Medicare IM give by:    Date Additional Medicare IM Given:    Additional Medicare Important Message give by:     If discussed at [place] of Stay Meetings, dates discussed:    Additional Comments:  Marshell Garfinkel, RN 07/13/2014, 1:24 PM

## 2014-07-13 NOTE — Transfer of Care (Signed)
Immediate Anesthesia Transfer of Care Note  Patient: Annette Leon  Procedure(s) Performed: Procedure(s): ESOPHAGOGASTRODUODENOSCOPY (EGD) (N/A)  Patient Location: PACU and Endoscopy Unit  Anesthesia Type:General  Level of Consciousness: awake, alert  and oriented  Airway & Oxygen Therapy: Patient Spontanous Breathing and Patient connected to nasal cannula oxygen  Post-op Assessment: Report given to RN and Post -op Vital signs reviewed and stable  Post vital signs: stable  Last Vitals:  Filed Vitals:   07/13/14 1257  BP:   Pulse:   Temp: 36.6 C  Resp:     Complications: No apparent anesthesia complications

## 2014-07-13 NOTE — Anesthesia Postprocedure Evaluation (Signed)
  Anesthesia Post-op Note  Patient: Annette Leon  Procedure(s) Performed: Procedure(s): ESOPHAGOGASTRODUODENOSCOPY (EGD) (N/A)  Anesthesia type:General  Patient location: PACU  Post pain: Pain level controlled  Post assessment: Post-op Vital signs reviewed, Patient's Cardiovascular Status Stable, Respiratory Function Stable, Patent Airway and No signs of Nausea or vomiting  Post vital signs: Reviewed and stable  Last Vitals:  Filed Vitals:   07/13/14 1300  BP:   Pulse:   Temp: 36.3 C  Resp:     Level of consciousness: awake, alert  and patient cooperative  Complications: No apparent anesthesia complications

## 2014-07-13 NOTE — Progress Notes (Signed)
Pt returned from EGD  Dr Manuella Ghazi visited with pt will advance diet and d/c tomorrow

## 2014-07-13 NOTE — H&P (Signed)
  Date of Initial H&P: 07/11/2013  History reviewed, patient examined, no change in status, stable for surgery.

## 2014-07-13 NOTE — Progress Notes (Signed)
[place] at [place] NAME: Annette Leon    MR#:  [id number]  DATE OF BIRTH:  01/19/1991  SUBJECTIVE:  Still somewhat nauseous, no vomiting. Mother at bedside  REVIEW OF SYSTEMS:  Review of Systems  Constitutional: Negative for fever, chills and weight loss.  HENT: Negative for nosebleeds and sore throat.   Eyes: Negative for blurred vision.  Respiratory: Negative for cough, shortness of breath and wheezing.   Cardiovascular: Negative for chest pain, orthopnea, leg swelling and PND.  Gastrointestinal: Negative for heartburn, nausea, vomiting, abdominal pain, diarrhea and constipation.  Genitourinary: Negative for dysuria and urgency.  Musculoskeletal: Negative for back pain.  Skin: Negative for rash.  Neurological: Negative for dizziness, speech change, focal weakness and headaches.  Endo/Heme/Allergies: Does not bruise/bleed easily.  Psychiatric/Behavioral: Negative for depression.    DRUG ALLERGIES:   Allergies  Allergen Reactions  . Latex Anaphylaxis  . Amoxicillin Hives  . Penicillins Hives    VITALS:  Blood pressure 125/65, pulse 65, temperature 97.7 F (36.5 C), temperature source Oral, resp. rate 22, height 5\' 9"  (1.753 m), weight 106.323 kg (234 lb 6.4 oz), last menstrual period 06/12/2014, SpO2 100 %.  PHYSICAL EXAMINATION:  GENERAL:  24 y.o.-year-old patient lying in the bed with no acute distress.  EYES: Pupils equal, round, reactive to light and accommodation. No scleral icterus. Extraocular muscles intact.  HEENT: Head atraumatic, normocephalic. Oropharynx and nasopharynx clear.  NECK:  Supple, no jugular venous distention. No thyroid enlargement, no tenderness.  LUNGS: Normal breath sounds bilaterally, no wheezing, rales,rhonchi or crepitation. No use of accessory muscles of respiration.  CARDIOVASCULAR: S1, S2 normal. No murmurs, rubs, or gallops.  ABDOMEN: Minimal epigastric tenderness, nondistended.  Bowel sounds present. No organomegaly or mass.  EXTREMITIES: No pedal edema, cyanosis, or clubbing.  NEUROLOGIC: Cranial nerves II through XII are intact. Muscle strength 5/5 in all extremities. Sensation intact. Gait not checked.  PSYCHIATRIC: The patient is alert and oriented x 3.  SKIN: No obvious rash, lesion, or ulcer.    LABORATORY PANEL:   CBC  Recent Labs Lab 07/12/14 0455  WBC 12.5*  HGB 11.9*  HCT 34.9*  PLT 234   ------------------------------------------------------------------------------------------------------------------  Chemistries   Recent Labs Lab 07/11/14 1725 07/12/14 0455  NA 136 139  K 3.6 3.7  CL 99* 105  CO2 27 27  GLUCOSE 140* 130*  BUN 9 8  CREATININE 0.53 0.53  CALCIUM 9.3 8.7*  AST 58*  --   ALT 56*  --   ALKPHOS 83  --   BILITOT 0.5  --    -----------------------------------------------------------------------------------------------------------------  RADIOLOGY:  Ct Abdomen Pelvis W Contrast  07/11/2014   CLINICAL DATA:  Abdominal and pelvic pain with nausea and vomiting for 1 day.  EXAM: CT ABDOMEN AND PELVIS WITH CONTRAST  TECHNIQUE: Multidetector CT imaging of the abdomen and pelvis was performed using the standard protocol following bolus administration of intravenous contrast.  CONTRAST:  164mL OMNIPAQUE IOHEXOL 300 MG/ML  SOLN  COMPARISON:  None.  FINDINGS: Lower chest:  Unremarkable  Hepatobiliary: Hepatic steatosis identified. No other hepatic abnormality identified. The gallbladder is unremarkable. There is no evidence of biliary dilatation.  Pancreas: Unremarkable  Spleen: Unremarkable  Adrenals/Urinary Tract: The kidneys, adrenal glands and bladder are unremarkable.  Stomach/Bowel: Unremarkable. There is no evidence of bowel obstruction or bowel wall thickening. The appendix is normal.  Vascular/Lymphatic: No enlarged lymph nodes or vascular abnormality.  Reproductive: The uterus and adnexal regions are unremarkable.  Other: No  free fluid, abscess or pneumoperitoneum.  Musculoskeletal: No acute or suspicious abnormalities.  IMPRESSION: Hepatic steatosis without other significant abnormality.   Electronically Signed   By: Margarette Canada M.D.   On: 07/11/2014 21:06     ASSESSMENT AND PLAN:   * Hematemesis - none in the hospital. EGD showed gastritis and esophagitis. Switch to oral Protonix twice a day.  * Intractable nausea and vomiting - resolved. Advance diet  * Urinary frequency - UA is negative. No need of antibiotics  * Anxiety - stable problem, continue home dose of Zoloft.  * Pre-diabetes - controlled with metformin by mouth at home, we will hold her metformin for now as it could contribute to her nausea, we will have her on sliding scale insulin for insulin sensitive patients while here.    All the records are reviewed and case discussed with Care Management/Social Worker. Management plans discussed with the patient, family and they are in agreement.  CODE STATUS: Full code  TOTAL TIME TAKING CARE OF THIS PATIENT: 35 minutes.   POSSIBLE D/C IN am, DEPENDING ON CLINICAL CONDITION.   [facility], Darrell Leonhardt M.D on 07/13/2014 at 4:11 PM  Between 7am to 6pm - Pager - [phone]  After 6pm go to [email or web address] - password [email or web address] [facility]  Office  [phone]  CC: Primary care physician; Chad Cordial, PA-C

## 2014-07-13 NOTE — Progress Notes (Signed)
Patient ID: Annette Leon, female   DOB: 01-16-1991, 24 y.o.   MRN: [id number] More vomiting yesterday and this AM. EGD showed gastritis and esophagitis. Gastric bx taken for H.pylori. Cytology brushings taken for candidiasis. Full liquid diet. Advance as tolerated. Protonix bid ordered. Continue on discharge. Thanks.

## 2014-07-13 NOTE — Anesthesia Procedure Notes (Signed)
Procedure Name: MAC Performed by: Kennon Holter Pre-anesthesia Checklist: Patient identified, Emergency Drugs available, Suction available, Patient being monitored and Timeout performed Patient Re-evaluated:Patient Re-evaluated prior to inductionOxygen Delivery Method: Nasal cannula Preoxygenation: Pre-oxygenation with 100% oxygen Intubation Type: IV induction

## 2014-07-14 ENCOUNTER — Encounter: Payer: Self-pay | Admitting: Gastroenterology

## 2014-07-14 LAB — SURGICAL PATHOLOGY

## 2014-07-14 LAB — GLUCOSE, CAPILLARY: GLUCOSE-CAPILLARY: 144 mg/dL — AB (ref 65–99)

## 2014-07-14 MED ORDER — PANTOPRAZOLE SODIUM 40 MG PO TBEC: 40.0000 mg | DELAYED_RELEASE_TABLET | Freq: Two times a day (BID) | ORAL | Status: DC

## 2014-07-14 NOTE — Progress Notes (Signed)
Fidencia had pleasant night, slept well. Denies pain/nausea. Ready to be discharged today. Nursing continues to monitor and assist with ADLs.

## 2014-07-14 NOTE — Progress Notes (Signed)
Patient discharged via wheelchair with volunteer and mother.

## 2014-07-14 NOTE — Discharge Summary (Signed)
[place] [place] NAME: Annette Leon    MR#:  [id number]  DATE OF BIRTH:  12-18-1990  DATE OF ADMISSION:  07/11/2014 ADMITTING PHYSICIAN: Lance Coon, MD  DATE OF DISCHARGE: 07/14/2014  PRIMARY CARE PHYSICIAN: COPLAND, ALICIA B, PA-C    ADMISSION DIAGNOSIS:  Intractable vomiting with nausea, vomiting of unspecified type [R11.10]  DISCHARGE DIAGNOSIS:  nausea and vomiting likely due to gastritis  SECONDARY DIAGNOSIS:   Past Medical History  Diagnosis Date  . Anxiety   . Pre-diabetes     HOSPITAL COURSE:  23 year old female with the above-mentioned medical problem was admitted for nausea and vomiting and hematemesis. Please see Dr. Vara Guardian dictated history and physical for further details. She was evaluated by GI and was recommended EGD which was performed showing gastritis and esophagitis for which she was started on twice a day Protonix. she was doing much better and was tolerating diet and is being discharged home in stable condition she is in agreement with discharge plan including her family also was at the bedside.  * Hematemesis - none in the hospital. EGD showed gastritis and esophagitis. Switched to oral Protonix twice a day.  * Intractable nausea and vomiting - resolved. Advance diet  * Urinary frequency - UA is negative. No need of antibiotics  * Anxiety - stable problem, continue home dose of Zoloft.  * Pre-diabetes - controlled with metformin by mouth at home.   CONSULTS OBTAINED:    gastroenterology Dr. Verdie Shire  DRUG ALLERGIES:   Allergies  Allergen Reactions  . Latex Anaphylaxis  . Amoxicillin Hives  . Penicillins Hives    DISCHARGE MEDICATIONS:   Current Discharge Medication List    START taking these medications   Details  pantoprazole (PROTONIX) 40 MG tablet Take 1 tablet (40 mg total) by mouth 2 (two) times daily. Qty: 60 tablet, Refills: 1      CONTINUE these medications  which have NOT CHANGED   Details  metFORMIN (GLUCOPHAGE-XR) 500 MG 24 hr tablet Take 500 mg by mouth 2 (two) times daily.    Norethin-Eth Estradiol-Fe [facility] FE,WYMZYA Orrin Brigham) 0.4-35 MG-MCG tablet Chew 1 tablet by mouth daily.    ondansetron (ZOFRAN-ODT) 8 MG disintegrating tablet Take 1 tablet by mouth 3 (three) times daily as needed. For symptoms. Refills: 0    sertraline (ZOLOFT) 50 MG tablet Take 50 mg by mouth.         DISCHARGE INSTRUCTIONS:     DIET:  Regular diet  DISCHARGE CONDITION:  Good  ACTIVITY:  Activity as tolerated  OXYGEN:  Home Oxygen: No.   Oxygen Delivery: room air  DISCHARGE LOCATION:  home   If you experience worsening of your admission symptoms, develop shortness of breath, life threatening emergency, suicidal or homicidal thoughts you must seek medical attention immediately by calling 911 or calling your MD immediately  if symptoms less severe.  You Must read complete instructions/literature along with all the possible adverse reactions/side effects for all the Medicines you take and that have been prescribed to you. Take any new Medicines after you have completely understood and accpet all the possible adverse reactions/side effects.   Please note  You were cared for by a hospitalist during your hospital stay. If you have any questions about your discharge medications or the care you received while you were in the hospital after you are discharged, you can call the unit and asked to speak with the hospitalist on call if  the hospitalist that took care of you is not available. Once you are discharged, your primary care physician will handle any further medical issues. Please note that NO REFILLS for any discharge medications will be authorized once you are discharged, as it is imperative that you return to your primary care physician (or establish a relationship with a primary care physician if you do not have one) for your aftercare  needs so that they can reassess your need for medications and monitor your lab values.    On the date of discharge :   VITAL SIGNS:  Blood pressure 126/78, pulse 65, temperature 98.1 F (36.7 C), temperature source Oral, resp. rate 18, height 5\' 9"  (1.753 m), weight 106.323 kg (234 lb 6.4 oz), last menstrual period 06/12/2014, SpO2 100 %.  I/O:   Intake/Output Summary (Last 24 hours) at 07/14/14 0947 Last data filed at 07/13/14 1630  Gross per 24 hour  Intake    640 ml  Output      0 ml  Net    640 ml    PHYSICAL EXAMINATION:  GENERAL:  24 y.o.-year-old patient lying in the bed with no acute distress.  EYES: Pupils equal, round, reactive to light and accommodation. No scleral icterus. Extraocular muscles intact.  HEENT: Head atraumatic, normocephalic. Oropharynx and nasopharynx clear.  NECK:  Supple, no jugular venous distention. No thyroid enlargement, no tenderness.  LUNGS: Normal breath sounds bilaterally, no wheezing, rales,rhonchi or crepitation. No use of accessory muscles of respiration.  CARDIOVASCULAR: S1, S2 normal. No murmurs, rubs, or gallops.  ABDOMEN: Soft, non-tender, non-distended. Bowel sounds present. No organomegaly or mass.  EXTREMITIES: No pedal edema, cyanosis, or clubbing.  NEUROLOGIC: Cranial nerves II through XII are intact. Muscle strength 5/5 in all extremities. Sensation intact. Gait not checked.  PSYCHIATRIC: The patient is alert and oriented x 3.  SKIN: No obvious rash, lesion, or ulcer.   DATA REVIEW:   CBC  Recent Labs Lab 07/12/14 0455  WBC 12.5*  HGB 11.9*  HCT 34.9*  PLT 234    Chemistries   Recent Labs Lab 07/11/14 1725 07/12/14 0455  NA 136 139  K 3.6 3.7  CL 99* 105  CO2 27 27  GLUCOSE 140* 130*  BUN 9 8  CREATININE 0.53 0.53  CALCIUM 9.3 8.7*  AST 58*  --   ALT 56*  --   ALKPHOS 83  --   BILITOT 0.5  --     Management plans discussed with the patient, family and they are in agreement.  CODE STATUS: Full  code  TOTAL TIME TAKING CARE OF THIS PATIENT: 45 minutes.    [facility], Kushi Kun M.D on 07/14/2014 at 9:47 AM  Between 7am to 6pm - Pager - [phone]  After 6pm go to [email or web address] - password [email or web address] [facility]  Office  [phone]  CC: Primary care physician; Chad Cordial, PA-C

## 2014-07-14 NOTE — Discharge Instructions (Signed)

## 2014-07-14 NOTE — Progress Notes (Signed)
Patient for discharge home.IV discontinued.discharged instructions given and prescription to pharmacy. Pt. Verbalized understanding of d/c instructions. Condition stable.

## 2014-07-16 LAB — CYTOLOGY - NON PAP

## 2014-09-21 ENCOUNTER — Emergency Department: Payer: BLUE CROSS/BLUE SHIELD

## 2014-09-21 ENCOUNTER — Encounter: Payer: Self-pay | Admitting: Emergency Medicine

## 2014-09-21 ENCOUNTER — Emergency Department
Admission: EM | Admit: 2014-09-21 | Discharge: 2014-09-21 | Disposition: A | Discharge status: Home / Self Care | Payer: BLUE CROSS/BLUE SHIELD | Attending: Emergency Medicine | Admitting: Emergency Medicine

## 2014-09-21 DIAGNOSIS — Z79899 Other long term (current) drug therapy: Secondary | ICD-10-CM | POA: Diagnosis not present

## 2014-09-21 DIAGNOSIS — R197 Diarrhea, unspecified: Secondary | ICD-10-CM | POA: Insufficient documentation

## 2014-09-21 DIAGNOSIS — R109 Unspecified abdominal pain: Secondary | ICD-10-CM | POA: Diagnosis present

## 2014-09-21 DIAGNOSIS — K920 Hematemesis: Secondary | ICD-10-CM | POA: Diagnosis not present

## 2014-09-21 DIAGNOSIS — Z91040 Latex allergy status: Secondary | ICD-10-CM | POA: Insufficient documentation

## 2014-09-21 DIAGNOSIS — R1084 Generalized abdominal pain: Secondary | ICD-10-CM

## 2014-09-21 DIAGNOSIS — Z3202 Encounter for pregnancy test, result negative: Secondary | ICD-10-CM | POA: Diagnosis not present

## 2014-09-21 DIAGNOSIS — Z880 Allergy status to penicillin: Secondary | ICD-10-CM | POA: Diagnosis not present

## 2014-09-21 LAB — COMPREHENSIVE METABOLIC PANEL
ALT: 43 U/L (ref 14–54)
ANION GAP: 11 (ref 5–15)
AST: 42 U/L — AB (ref 15–41)
Albumin: 3.9 g/dL (ref 3.5–5.0)
Alkaline Phosphatase: 74 U/L (ref 38–126)
BUN: 9 mg/dL (ref 6–20)
CO2: 25 mmol/L (ref 22–32)
CREATININE: 0.63 mg/dL (ref 0.44–1.00)
Calcium: 8.7 mg/dL — ABNORMAL LOW (ref 8.9–10.3)
Chloride: 98 mmol/L — ABNORMAL LOW (ref 101–111)
GFR calc Af Amer: >60 mL/min (ref >60)
GFR calc non Af Amer: >60 mL/min (ref >60)
Glucose, Bld: 206 mg/dL — ABNORMAL HIGH (ref 65–99)
Potassium: 3.3 mmol/L — ABNORMAL LOW (ref 3.5–5.1)
Sodium: 134 mmol/L — ABNORMAL LOW (ref 135–145)
Total Bilirubin: 1.2 mg/dL (ref 0.3–1.2)
Total Protein: 8.4 g/dL — ABNORMAL HIGH (ref 6.5–8.1)

## 2014-09-21 LAB — URINALYSIS COMPLETE WITH MICROSCOPIC (ARMC ONLY)
BILIRUBIN URINE: NEGATIVE
GLUCOSE, UA: 50 mg/dL — AB
HGB URINE DIPSTICK: NEGATIVE
KETONES UR: NEGATIVE mg/dL
LEUKOCYTES UA: NEGATIVE
NITRITE: NEGATIVE
Protein, ur: 100 mg/dL — AB
SPECIFIC GRAVITY, URINE: 1.03 (ref 1.005–1.030)
pH: 5 (ref 5.0–8.0)

## 2014-09-21 LAB — CBC WITH DIFFERENTIAL/PLATELET
Basophils Absolute: 0 10*3/uL (ref 0–0.1)
Basophils Relative: 0 %
EOS ABS: 0 10*3/uL (ref 0–0.7)
EOS PCT: 0 %
HCT: 40.4 % (ref 35.0–47.0)
Hemoglobin: 13.6 g/dL (ref 12.0–16.0)
LYMPHS PCT: 13 %
Lymphs Abs: 1.4 10*3/uL (ref 1.0–3.6)
MCH: 29.4 pg (ref 26.0–34.0)
MCHC: 33.6 g/dL (ref 32.0–36.0)
MCV: 87.5 fL (ref 80.0–100.0)
MONO ABS: 1.1 10*3/uL — AB (ref 0.2–0.9)
MONOS PCT: 11 %
Neutro Abs: 8.2 10*3/uL — ABNORMAL HIGH (ref 1.4–6.5)
Neutrophils Relative %: 76 %
PLATELETS: 239 10*3/uL (ref 150–440)
RBC: 4.62 MIL/uL (ref 3.80–5.20)
RDW: 13.8 % (ref 11.5–14.5)
WBC: 10.8 10*3/uL (ref 3.6–11.0)

## 2014-09-21 LAB — C DIFFICILE QUICK SCREEN W PCR REFLEX
C Diff antigen: POSITIVE — AB
C Diff toxin: NEGATIVE

## 2014-09-21 LAB — MRSA PCR SCREENING: MRSA BY PCR: NEGATIVE

## 2014-09-21 LAB — POCT PREGNANCY, URINE: Preg Test, Ur: NEGATIVE

## 2014-09-21 LAB — LIPASE, BLOOD: LIPASE: 15 U/L — AB (ref 22–51)

## 2014-09-21 MED ORDER — GI COCKTAIL ~~LOC~~
30.0000 mL | Freq: Once | ORAL | Status: AC
  Administered 2014-09-21: 30 mL via ORAL
  Filled 2014-09-21: qty 30

## 2014-09-21 MED ORDER — FAMOTIDINE IN NACL 20-0.9 MG/50ML-% IV SOLN
20.0000 mg | Freq: Once | INTRAVENOUS | Status: AC
  Administered 2014-09-21: 20 mg via INTRAVENOUS
  Filled 2014-09-21: qty 50

## 2014-09-21 MED ORDER — RANITIDINE HCL 150 MG PO TABS: 150.0000 mg | ORAL_TABLET | Freq: Two times a day (BID) | ORAL | Status: DC

## 2014-09-21 MED ORDER — DICYCLOMINE HCL 20 MG PO TABS: 20.0000 mg | ORAL_TABLET | Freq: Three times a day (TID) | ORAL | Status: DC | PRN

## 2014-09-21 MED ORDER — SODIUM CHLORIDE 0.9 % IV BOLUS (SEPSIS)
1000.0000 mL | Freq: Once | INTRAVENOUS | Status: AC
  Administered 2014-09-21: 1000 mL via INTRAVENOUS

## 2014-09-21 MED ORDER — ONDANSETRON HCL 4 MG PO TABS: 4.0000 mg | ORAL_TABLET | Freq: Every day | ORAL | Status: DC | PRN

## 2014-09-21 MED ORDER — ONDANSETRON HCL 4 MG/2ML IJ SOLN
4.0000 mg | Freq: Once | INTRAMUSCULAR | Status: AC
  Administered 2014-09-21: 4 mg via INTRAVENOUS
  Filled 2014-09-21: qty 2

## 2014-09-21 NOTE — Progress Notes (Signed)
   09/21/14 1700  Clinical Encounter Type  Visited With Patient and family together  Visit Type Spiritual support  Spiritual Encounters  Spiritual Needs Emotional  Stress Factors  Patient Stress Factors Health changes   Faith: [place] Status: Hematemesis/alert and oriented Family: Mom bedside Visit Assessment: The patient says she's feeling better. She's was on phone with her Dad. The mom says that she is returning from [place]. The chaplain introduced pastoral care and gave encouraging words to the patient and her mom.  Pastoral Care can be reached via pager [phone] and by submitting an online request

## 2014-09-21 NOTE — Discharge Instructions (Signed)

## 2014-09-21 NOTE — ED Provider Notes (Signed)
[facility] Emergency Department Provider Note  ____________________________________________  Time seen: Approximately 1:10 PM  I have reviewed the triage vital signs and the nursing notes.   HISTORY  Chief Complaint Abdominal Pain    HPI Annette Leon is a 24 y.o. female with a history of prediabetes and gastritis who presents today with left upper quadrant abdominal pain as well as nausea vomiting and diarrhea. The symptoms have been intermittent for the last 2 months. She was previously seen at [facility] were she was diagnosed with gastritis and esophagitis after having a endoscopy done. She's also been having greater than 9 episodes of diarrhea over the past 2 days. She denies any recent antibiotics. However, she does work as a Marine scientist at [employer]. No recent travel or camping trips. No chest pain or shortness of breath. Taking birth control pills. No vaginal bleeding or discharge. Denies any urinary complaints. Says is not sexually active.Also notes vomiting dime to quarter sized clots of blood. Denies any coffee ground emesis. Says had been taking Protonix over 2 months but stopped secondary to no efficacy in controlling her vomiting or pain.  Past Medical History  Diagnosis Date  . Anxiety   . Pre-diabetes     Patient Active Problem List   Diagnosis Date Noted  . Hematemesis 07/11/2014  . Intractable nausea and vomiting 07/11/2014  . Urinary frequency 07/11/2014  . Anxiety 07/11/2014  . Pre-diabetes 07/11/2014    Past Surgical History  Procedure Laterality Date  . Salpingectomy      partial  . Cystectomy      BL fallopian tube cysts  . Esophagogastroduodenoscopy N/A 07/13/2014    Procedure: ESOPHAGOGASTRODUODENOSCOPY (EGD);  Surgeon: Hulen Luster, MD;  Location: [facility] ENDOSCOPY;  Service: Gastroenterology;  Laterality: N/A;    Current Outpatient Rx  Name  Route  Sig  Dispense  Refill  . metFORMIN (GLUCOPHAGE-XR) 500 MG 24 hr tablet   Oral   Take 500  mg by mouth 2 (two) times daily.         Cyndie Chime Estradiol-Fe [facility] FE,WYMZYA Orrin Brigham) 0.4-35 MG-MCG tablet   Oral   Chew 1 tablet by mouth daily.         . ondansetron (ZOFRAN-ODT) 8 MG disintegrating tablet   Oral   Take 1 tablet by mouth 3 (three) times daily as needed. For symptoms.      0   . sertraline (ZOLOFT) 50 MG tablet   Oral   Take 50 mg by mouth.         . pantoprazole (PROTONIX) 40 MG tablet   Oral   Take 1 tablet (40 mg total) by mouth 2 (two) times daily.   60 tablet   1     Allergies Latex; Amoxicillin; and Penicillins  Family History  Problem Relation Age of Onset  . Diabetes      pre-diabetic grandfather    Social History History  Substance Use Topics  . Smoking status: Never Smoker   . Smokeless tobacco: Never Used  . Alcohol Use: 0.0 oz/week    0 Standard drinks or equivalent per week     Comment: social drinker    Review of Systems Constitutional: No fever/chills Eyes: No visual changes. ENT: No sore throat. Cardiovascular: Denies chest pain. Respiratory: Denies shortness of breath. Gastrointestinal:No constipation. Genitourinary: Negative for dysuria. Musculoskeletal: Negative for back pain. Skin: Negative for rash. Neurological: Negative for headaches, focal weakness or numbness.  10-point ROS otherwise negative.  ____________________________________________   PHYSICAL  EXAM:  VITAL SIGNS: ED Triage Vitals  Enc Vitals Group     BP 09/21/14 1239 141/101 mmHg     Pulse Rate 09/21/14 1239 116     Resp --      Temp 09/21/14 1239 98.5 F (36.9 C)     Temp Source 09/21/14 1239 Oral     SpO2 09/21/14 1239 95 %     Weight 09/21/14 1239 230 lb (104.327 kg)     Height 09/21/14 1239 5\' 8"  (1.727 m)     Head Cir --      Peak Flow --      Pain Score 09/21/14 1243 7     Pain Loc --      Pain Edu? --      Excl. in [place]? --     Constitutional: Alert and oriented. Well appearing and in no acute  distress. Eyes: Conjunctivae are normal. PERRL. EOMI. Head: Atraumatic. Nose: No congestion/rhinnorhea. Mouth/Throat: Mucous membranes are moist.  Oropharynx non-erythematous. Neck: No stridor.   Cardiovascular: Normal rate, regular rhythm. Grossly normal heart sounds.  Good peripheral circulation. Respiratory: Normal respiratory effort.  No retractions. Lungs CTAB. Gastrointestinal: Soft with tenderness to the right upper and left lower quadrants. There is negative Murphy sign. There is no rebound or guarding. The tenderness is mild. No distention. No abdominal bruits. No CVA tenderness. Musculoskeletal: No lower extremity tenderness nor edema.  No joint effusions. Neurologic:  Normal speech and language. No gross focal neurologic deficits are appreciated. No gait instability. Skin:  Skin is warm, dry and intact. No rash noted. Psychiatric: Mood and affect are normal. Speech and behavior are normal.  ____________________________________________   LABS (all labs ordered are listed, but only abnormal results are displayed)  Labs Reviewed  C DIFFICILE QUICK SCAN W PCR REFLEX ([facility] ONLY) - Abnormal; Notable for the following:    C Diff antigen POSITIVE (*)    All other components within normal limits  URINALYSIS COMPLETEWITH MICROSCOPIC ([facility] ONLY) - Abnormal; Notable for the following:    Color, Urine YELLOW (*)    APPearance TURBID (*)    Glucose, UA 50 (*)    Protein, ur 100 (*)    Bacteria, UA RARE (*)    Squamous Epithelial / LPF 6-30 (*)    All other components within normal limits  LIPASE, BLOOD - Abnormal; Notable for the following:    Lipase 15 (*)    All other components within normal limits  CBC WITH DIFFERENTIAL/PLATELET - Abnormal; Notable for the following:    Neutro Abs 8.2 (*)    Monocytes Absolute 1.1 (*)    All other components within normal limits  COMPREHENSIVE METABOLIC PANEL - Abnormal; Notable for the following:    Sodium 134 (*)    Potassium 3.3 (*)     Chloride 98 (*)    Glucose, Bld 206 (*)    Calcium 8.7 (*)    Total Protein 8.4 (*)    AST 42 (*)    All other components within normal limits  MRSA PCR SCREENING  POCT PREGNANCY, URINE  POC URINE PREG, ED   ____________________________________________  EKG   ____________________________________________  RADIOLOGY  No active cardiopulmonary disease on the chest x-ray. I personally reviewed these images.   ____________________________________________   PROCEDURES    ____________________________________________   INITIAL IMPRESSION / ASSESSMENT AND PLAN / ED COURSE  Pertinent labs & imaging results that were available during my care of the patient were reviewed by me and considered in my  medical decision making (see chart for details).  ----------------------------------------- 7:44 PM on 09/21/2014 -----------------------------------------  Patient with symptomatically improvement after medication. We'll discharge home with refill of Zofran prescription as well as Bentyl. Has a follow-up appointment with Dr. Candace Cruise on August 2. Possibly vomiting blood secondary to Mallory-Weiss tear. ____________________________________________   FINAL CLINICAL IMPRESSION(S) / ED DIAGNOSES   Acute abdominal pain with nausea vomiting and diarrhea. Initial visit.   Orbie Pyo, MD 09/21/14 [phone]

## 2014-09-21 NOTE — ED Notes (Signed)

## 2014-09-21 NOTE — ED Notes (Signed)
Reports gastritis dx a month ago, reports random episodes of v/d since but yesterday and today consistent n/v/d

## 2016-05-21 NOTE — Progress Notes (Addendum)
HPI:      [name] is a 26 y.o. No obstetric history on file. who LMP was Patient's last menstrual period was 05/14/2016 (exact date)., presents today for her annual examination.  Her menses are regular every 1-2 months, lasting 4 days.  Dysmenorrhea mild, occurring first 1-2 days of flow. She does not have intermenstrual bleeding.  Sex activity: not sexually active.  Last Pap: March 30, 2014  Results were: no abnormalities  Hx of STDs: none   There is no FH of breast cancer. There is no FH of ovarian cancer. The patient does do self-breast exams.  Tobacco use: The patient denies current or previous tobacco use. Alcohol use: social drinker Exercise: moderately active  She does get adequate calcium and Vitamin D in her diet.  She has a hx of pre-DM and treated with metformin. She ran out of Rx 4 months ago. She has been doing diet/exercise changes. She has noticed vaginal itching/irritation recently.   Past Medical History:  Diagnosis Date  . Anxiety   . Pre-diabetes     Past Surgical History:  Procedure Laterality Date  . CYSTECTOMY     BL fallopian tube cysts  . ESOPHAGOGASTRODUODENOSCOPY N/A 07/13/2014   Procedure: ESOPHAGOGASTRODUODENOSCOPY (EGD);  Surgeon: Hulen Luster, MD;  Location: [facility] ENDOSCOPY;  Service: Gastroenterology;  Laterality: N/A;  . SALPINGECTOMY     partial    Family History  Problem Relation Age of Onset  . Diabetes      pre-diabetic grandfather     ROS:  Review of Systems  Constitutional: Negative for fever, malaise/fatigue and weight loss.  HENT: Negative for congestion, ear pain and sinus pain.   Respiratory: Negative for cough, shortness of breath and wheezing.   Cardiovascular: Negative for chest pain, orthopnea and leg swelling.  Gastrointestinal: Negative for constipation, diarrhea, nausea and vomiting.  Genitourinary: Negative for dysuria, frequency, hematuria and urgency.       Breast ROS: negative   Musculoskeletal:  Negative for back pain, joint pain and myalgias.  Skin: Negative for itching and rash.  Neurological: Negative for dizziness, tingling, focal weakness and headaches.  Endo/Heme/Allergies: Negative for environmental allergies. Does not bruise/bleed easily.  Psychiatric/Behavioral: Negative for depression and suicidal ideas. The patient is not nervous/anxious and does not have insomnia.     Objective: BP 120/80   Pulse 83   Ht 5\' 9"  (1.753 m)   Wt 247 lb (112 kg)   LMP 05/14/2016 (Exact Date)   BMI 36.48 kg/m    Physical Exam  Constitutional: She is oriented to person, place, and time. She appears well-developed and well-nourished.  Genitourinary: Vagina normal and uterus normal.  There is rash on the right labia.  There is rash on the left labia. No erythema or tenderness in the vagina. No vaginal discharge found. Right adnexum does not display mass and does not display tenderness. Left adnexum does not display mass and does not display tenderness. Cervix does not exhibit motion tenderness or polyp. Uterus is not enlarged or tender.  Neck: Normal range of motion. No thyromegaly present.  Cardiovascular: Normal rate, regular rhythm and normal heart sounds.   No murmur heard. Pulmonary/Chest: Effort normal and breath sounds normal. Right breast exhibits no mass, no nipple discharge, no skin change and no tenderness. Left breast exhibits no mass, no nipple discharge, no skin change and no tenderness.  Abdominal: Soft. There is no tenderness. There is no guarding.  Musculoskeletal: Normal range of motion.  Neurological: She  is alert and oriented to person, place, and time. No cranial nerve deficit.  Psychiatric: She has a normal mood and affect. Her behavior is normal.  Vitals reviewed.   Results: Results for orders placed or performed in visit on 05/22/16  POCT Wet Prep with KOH  Result Value Ref Range   Trichomonas, UA Negative    Clue Cells Wet Prep HPF POC neg    Epithelial Wet  Prep HPF POC  Few, Moderate, Many, Too numerous to count   Yeast Wet Prep HPF POC pos    Bacteria Wet Prep HPF POC  Few   RBC Wet Prep HPF POC     WBC Wet Prep HPF POC     KOH Prep POC Negative Negative     Assessment/Plan: Encounter for annual routine gynecological examination  Encounter for surveillance of contraceptive pills - OCP RF to [facility] and mail order. - Plan: Norethin-Eth Estradiol-Fe [facility] FE,WYMZYA FE,ZENCHENT FE,ZEOSA) 0.4-35 MG-MCG tablet, DISCONTINUED: Norethin-Eth Estradiol-Fe (FEMCON FE,WYMZYA FE,ZENCHENT FE,ZEOSA) 0.4-35 MG-MCG tablet  Cervical cancer screening - Plan: IGP, rfx Aptima HPV ASCU  Candidal vaginitis - Rx diflucan/clotrimazole-betamethasone crm. F/u prn.  - Plan: fluconazole (DIFLUCAN) 150 MG tablet, clotrimazole-betamethasone (LOTRISONE) cream, POCT Wet Prep with KOH  Blood tests for routine general physical examination - Plan: Comprehensive metabolic panel, Lipid panel, Hemoglobin A1c  Screening cholesterol level - Plan: Lipid panel  Pre-diabetes - Check labs. Will RF metformin based on lab levels. Diet/exercise/wt loss. Pt to establish care with PCP. - Plan: Hemoglobin A1c             GYN counsel adequate intake of calcium and vitamin D, diet and exercise     F/U  Return in about 1 year (around 05/22/2017).  Shafter Jupin B. Deyja Sochacki, PA-C 05/22/2016 10:23 AM   05/23/16  ADDENDUM: Notified pt of type 2 DM and hyperlipidemia via My Chart. Rx metformin eRxd and pt to f/u with new PCP. Cont diet/exercise/wt loss.

## 2016-05-22 ENCOUNTER — Ambulatory Visit (INDEPENDENT_AMBULATORY_CARE_PROVIDER_SITE_OTHER): Payer: BLUE CROSS/BLUE SHIELD | Admitting: Obstetrics and Gynecology

## 2016-05-22 ENCOUNTER — Encounter: Payer: Self-pay | Admitting: Obstetrics and Gynecology

## 2016-05-22 VITALS — BP 120/80 | HR 83 | Ht 69.0 in | Wt 247.0 lb

## 2016-05-22 DIAGNOSIS — R7303 Prediabetes: Secondary | ICD-10-CM

## 2016-05-22 DIAGNOSIS — Z124 Encounter for screening for malignant neoplasm of cervix: Secondary | ICD-10-CM

## 2016-05-22 DIAGNOSIS — B373 Candidiasis of vulva and vagina: Secondary | ICD-10-CM | POA: Diagnosis not present

## 2016-05-22 DIAGNOSIS — Z01419 Encounter for gynecological examination (general) (routine) without abnormal findings: Secondary | ICD-10-CM | POA: Diagnosis not present

## 2016-05-22 DIAGNOSIS — Z3041 Encounter for surveillance of contraceptive pills: Secondary | ICD-10-CM

## 2016-05-22 DIAGNOSIS — B3731 Acute candidiasis of vulva and vagina: Secondary | ICD-10-CM

## 2016-05-22 DIAGNOSIS — Z0000 Encounter for general adult medical examination without abnormal findings: Secondary | ICD-10-CM | POA: Diagnosis not present

## 2016-05-22 DIAGNOSIS — Z13220 Encounter for screening for lipoid disorders: Secondary | ICD-10-CM

## 2016-05-22 LAB — POCT WET PREP WITH KOH
Clue Cells Wet Prep HPF POC: NEGATIVE
KOH PREP POC: NEGATIVE
TRICHOMONAS UA: NEGATIVE
Yeast Wet Prep HPF POC: POSITIVE

## 2016-05-22 MED ORDER — NORETHIN-ETH ESTRADIOL-FE 0.4-35 MG-MCG PO CHEW: 1.0000 | CHEWABLE_TABLET | Freq: Every day | ORAL | 0 refills | Status: DC

## 2016-05-22 MED ORDER — FLUCONAZOLE 150 MG PO TABS: 150.0000 mg | ORAL_TABLET | Freq: Once | ORAL | 0 refills | Status: AC

## 2016-05-22 MED ORDER — NORETHIN-ETH ESTRADIOL-FE 0.4-35 MG-MCG PO CHEW: 1.0000 | CHEWABLE_TABLET | Freq: Every day | ORAL | 3 refills | Status: DC

## 2016-05-22 MED ORDER — CLOTRIMAZOLE-BETAMETHASONE 1-0.05 % EX CREA: 1.0000 "application " | TOPICAL_CREAM | Freq: Two times a day (BID) | CUTANEOUS | 0 refills | Status: DC

## 2016-05-23 ENCOUNTER — Other Ambulatory Visit: Payer: Self-pay | Admitting: Obstetrics and Gynecology

## 2016-05-23 ENCOUNTER — Encounter: Payer: Self-pay | Admitting: Obstetrics and Gynecology

## 2016-05-23 DIAGNOSIS — E1169 Type 2 diabetes mellitus with other specified complication: Secondary | ICD-10-CM | POA: Insufficient documentation

## 2016-05-23 DIAGNOSIS — E785 Hyperlipidemia, unspecified: Secondary | ICD-10-CM

## 2016-05-23 DIAGNOSIS — E119 Type 2 diabetes mellitus without complications: Secondary | ICD-10-CM

## 2016-05-23 HISTORY — DX: Type 2 diabetes mellitus without complications: E11.9

## 2016-05-23 LAB — COMPREHENSIVE METABOLIC PANEL
A/G RATIO: 1.3 (ref 1.2–2.2)
ALK PHOS: 113 IU/L (ref 39–117)
ALT: 74 IU/L — AB (ref 0–32)
AST: 43 IU/L — AB (ref 0–40)
Albumin: 4.2 g/dL (ref 3.5–5.5)
BILIRUBIN TOTAL: 0.4 mg/dL (ref 0.0–1.2)
BUN/Creatinine Ratio: 16 (ref 9–23)
BUN: 9 mg/dL (ref 6–20)
CHLORIDE: 94 mmol/L — AB (ref 96–106)
CO2: 22 mmol/L (ref 18–29)
Calcium: 9.4 mg/dL (ref 8.7–10.2)
Creatinine, Ser: 0.57 mg/dL (ref 0.57–1.00)
GFR calc Af Amer: 149 mL/min/{1.73_m2} (ref >59)
GFR calc non Af Amer: 129 mL/min/{1.73_m2} (ref >59)
Globulin, Total: 3.3 g/dL (ref 1.5–4.5)
Glucose: 299 mg/dL — ABNORMAL HIGH (ref 65–99)
POTASSIUM: 4.4 mmol/L (ref 3.5–5.2)
Sodium: 136 mmol/L (ref 134–144)
Total Protein: 7.5 g/dL (ref 6.0–8.5)

## 2016-05-23 LAB — HEMOGLOBIN A1C
Est. average glucose Bld gHb Est-mCnc: 275 mg/dL
HEMOGLOBIN A1C: 11.2 % — AB (ref 4.8–5.6)

## 2016-05-23 LAB — LIPID PANEL
Chol/HDL Ratio: 3.9 ratio units (ref 0.0–4.4)
Cholesterol, Total: 205 mg/dL — ABNORMAL HIGH (ref 100–199)
HDL: 53 mg/dL (ref >39)
LDL Calculated: 125 mg/dL — ABNORMAL HIGH (ref 0–99)
Triglycerides: 133 mg/dL (ref 0–149)
VLDL Cholesterol Cal: 27 mg/dL (ref 5–40)

## 2016-05-23 MED ORDER — METFORMIN HCL ER 500 MG PO TB24: 500.0000 mg | ORAL_TABLET | Freq: Two times a day (BID) | ORAL | 2 refills | Status: DC

## 2016-05-24 LAB — IGP, RFX APTIMA HPV ASCU: PAP Smear Comment: 0

## 2016-06-01 ENCOUNTER — Encounter: Payer: Self-pay | Admitting: Obstetrics and Gynecology

## 2016-07-27 IMAGING — CT CT ABD-PELV W/ CM
1 of 2 series · 15 of 32 positions shown, 19 images · IV contrast (omnipaque)
Comparison: None.

CLINICAL DATA: Abdominal and pelvic pain with nausea and vomiting
for 1 day.

EXAM:
CT ABDOMEN AND PELVIS WITH CONTRAST
TECHNIQUE: Multidetector CT imaging of the abdomen and pelvis was performed
using the standard protocol following bolus administration of
intravenous contrast.
CONTRAST:  100mL OMNIPAQUE IOHEXOL 300 MG/ML  SOLN

[Series 2: routine abd pel with · axial · 0.96mm/px · z∈[-930,-436]mm · 15 of 109 slices shown, 19 images]
[im 5/109  soft-tissue]
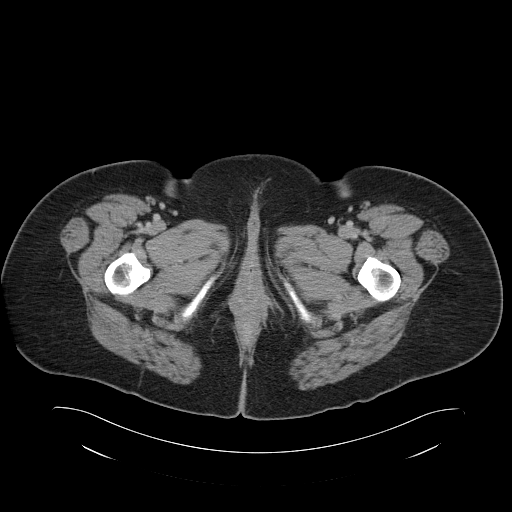
[im 5/109  bone]
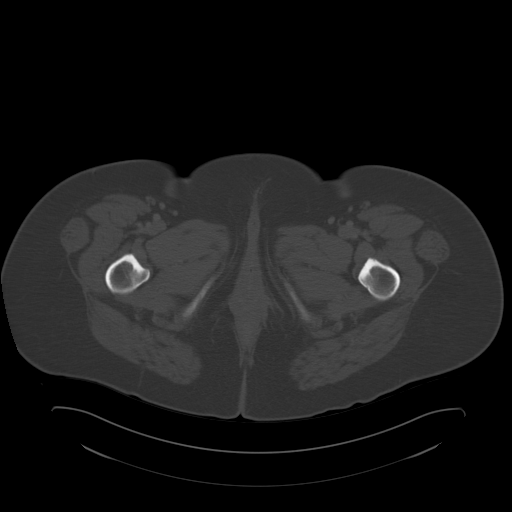
[im 14/109  soft-tissue]
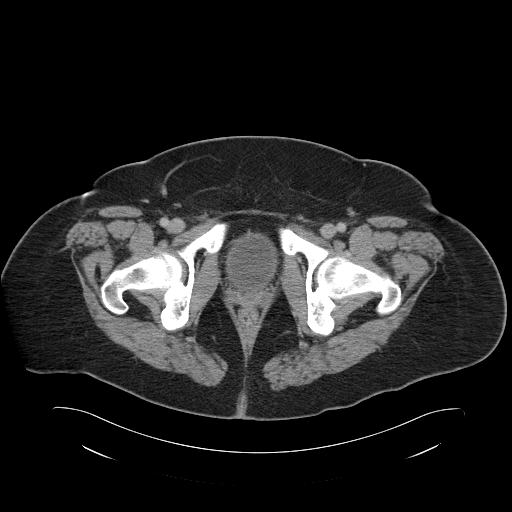
[im 23/109  soft-tissue]
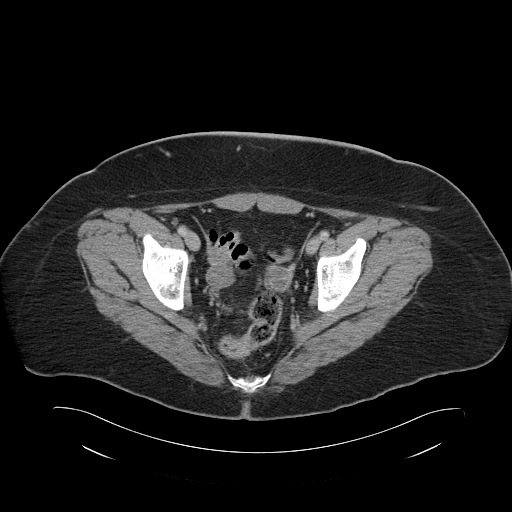
[im 32/109  soft-tissue]
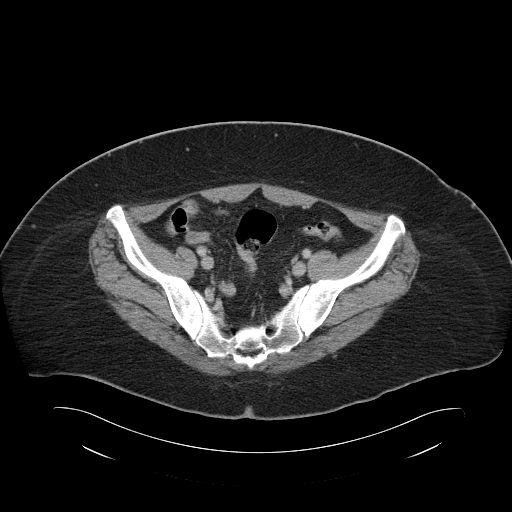
[im 37/109  soft-tissue]
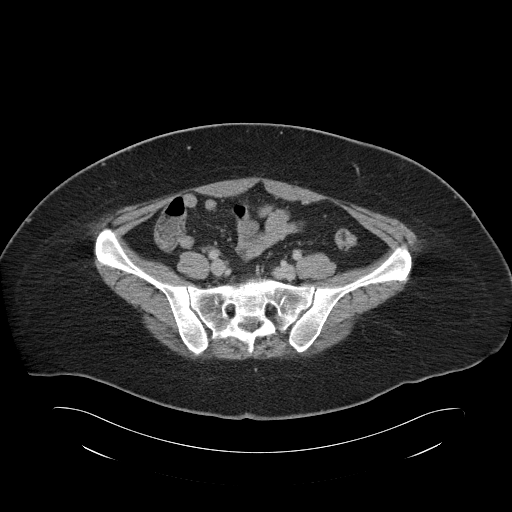
[im 46/109  soft-tissue]
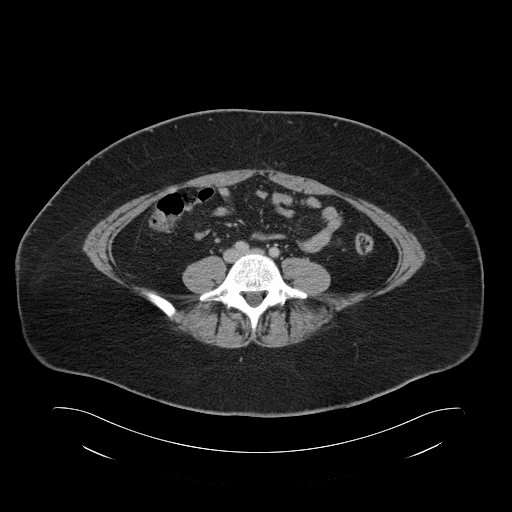
[im 55/109  soft-tissue]
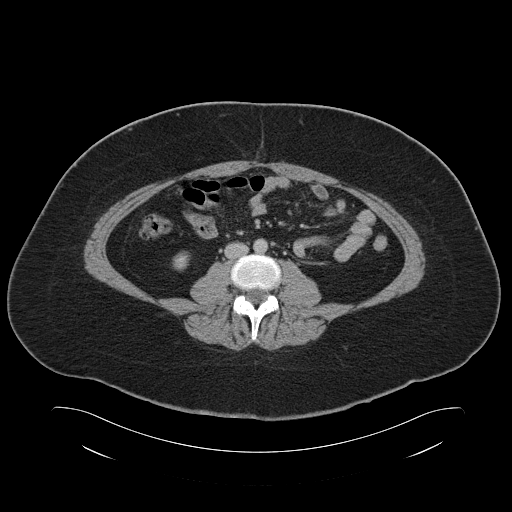
[im 64/109  soft-tissue]
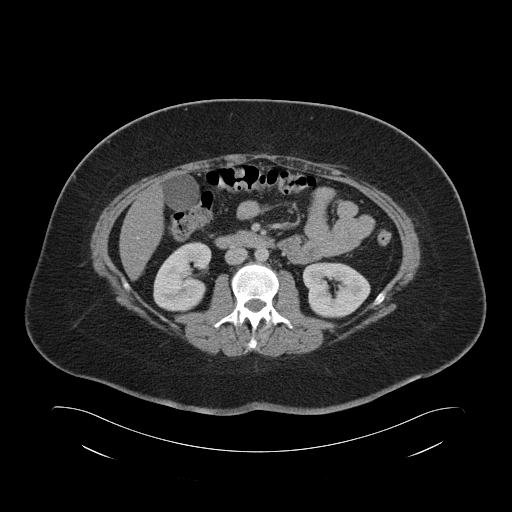
[im 73/109  soft-tissue]
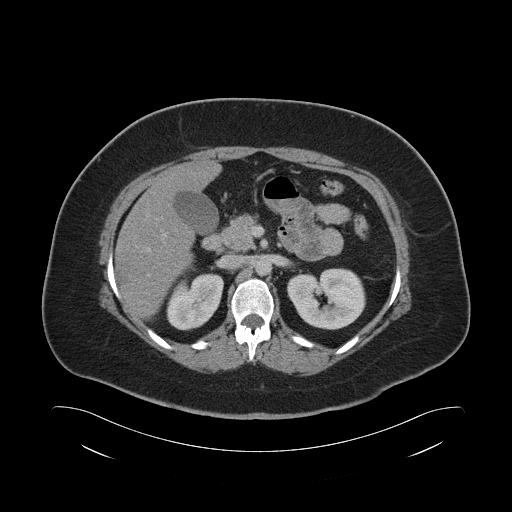
[im 73/109  bone]
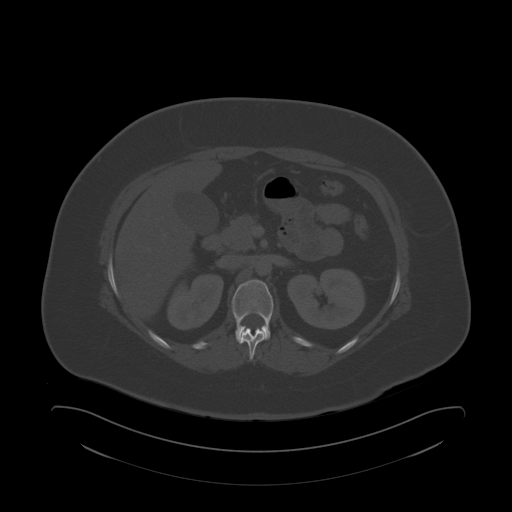
[im 77/109  soft-tissue]
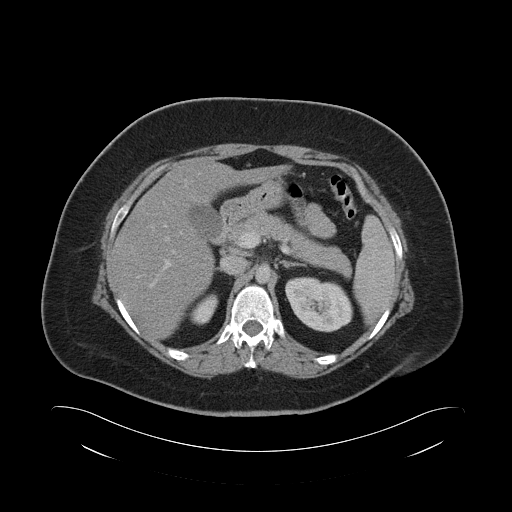
[im 86/109  soft-tissue]
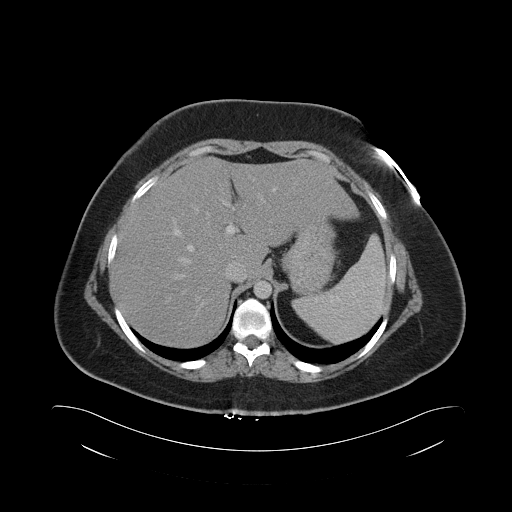
[im 91/109  lung]
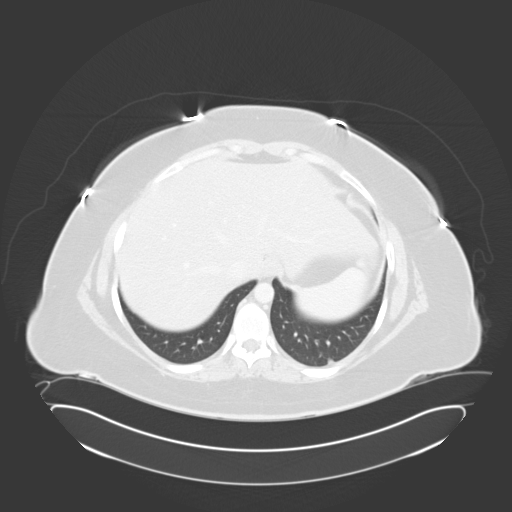
[im 95/109  soft-tissue]
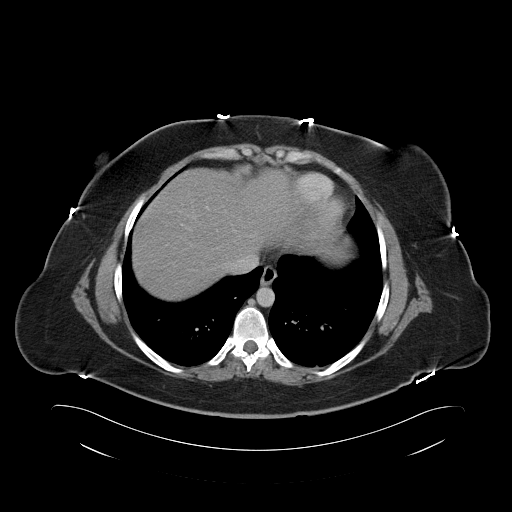
[im 95/109  lung]
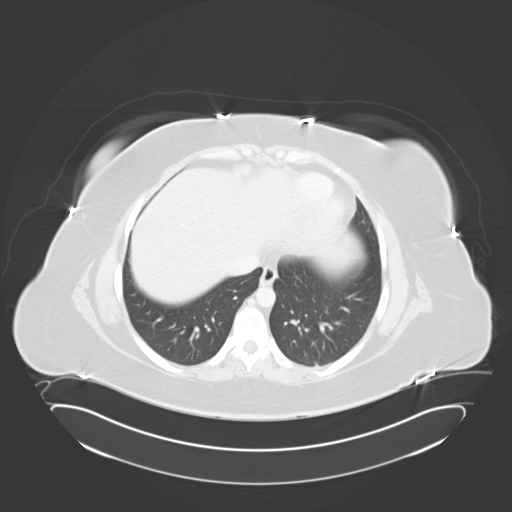
[im 100/109  lung]
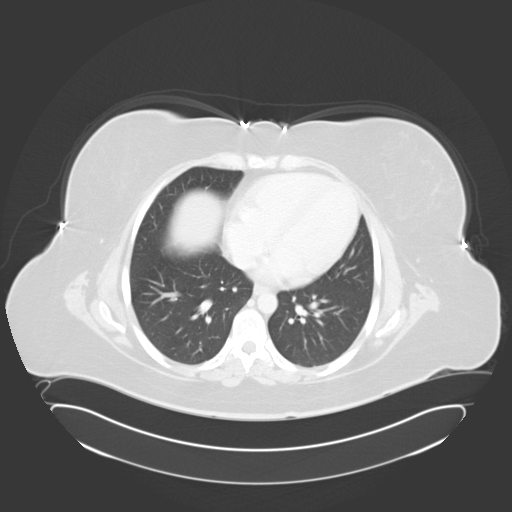
[im 104/109  soft-tissue]
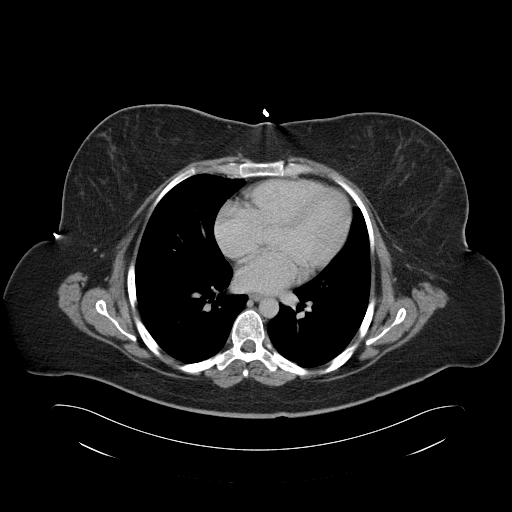
[im 104/109  lung]
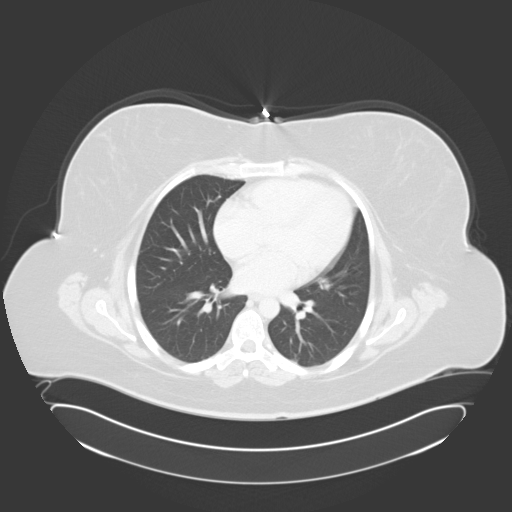

[15 of 32 positions shown; findings below may reference images not displayed]

FINDINGS: Lower chest:  Unremarkable

Hepatobiliary: Hepatic steatosis identified. No other hepatic
abnormality identified. The gallbladder is unremarkable. There is no
evidence of biliary dilatation.

Pancreas: Unremarkable

Spleen: Unremarkable

Adrenals/Urinary Tract: The kidneys, adrenal glands and bladder are
unremarkable.

Stomach/Bowel: Unremarkable. There is no evidence of bowel
obstruction or bowel wall thickening. The appendix is normal.

Vascular/Lymphatic: No enlarged lymph nodes or vascular abnormality.

Reproductive: The uterus and adnexal regions are unremarkable.

Other: No free fluid, abscess or pneumoperitoneum.

Musculoskeletal: No acute or suspicious abnormalities.
IMPRESSION: Hepatic steatosis without other significant abnormality.

## 2016-09-03 ENCOUNTER — Other Ambulatory Visit: Payer: Self-pay | Admitting: Obstetrics and Gynecology

## 2016-10-07 IMAGING — CR DG CHEST 2V
1 series · 2 of 2 positions shown · non-contrast
Comparison: None.

CLINICAL DATA: Vomiting blood and bad diarrhea past 24 hours.

EXAM:
CHEST  2 VIEW

[Series 1: dg chest 2 view · 0.14mm/px · 2 of 2 slices shown]
[im 1/2]
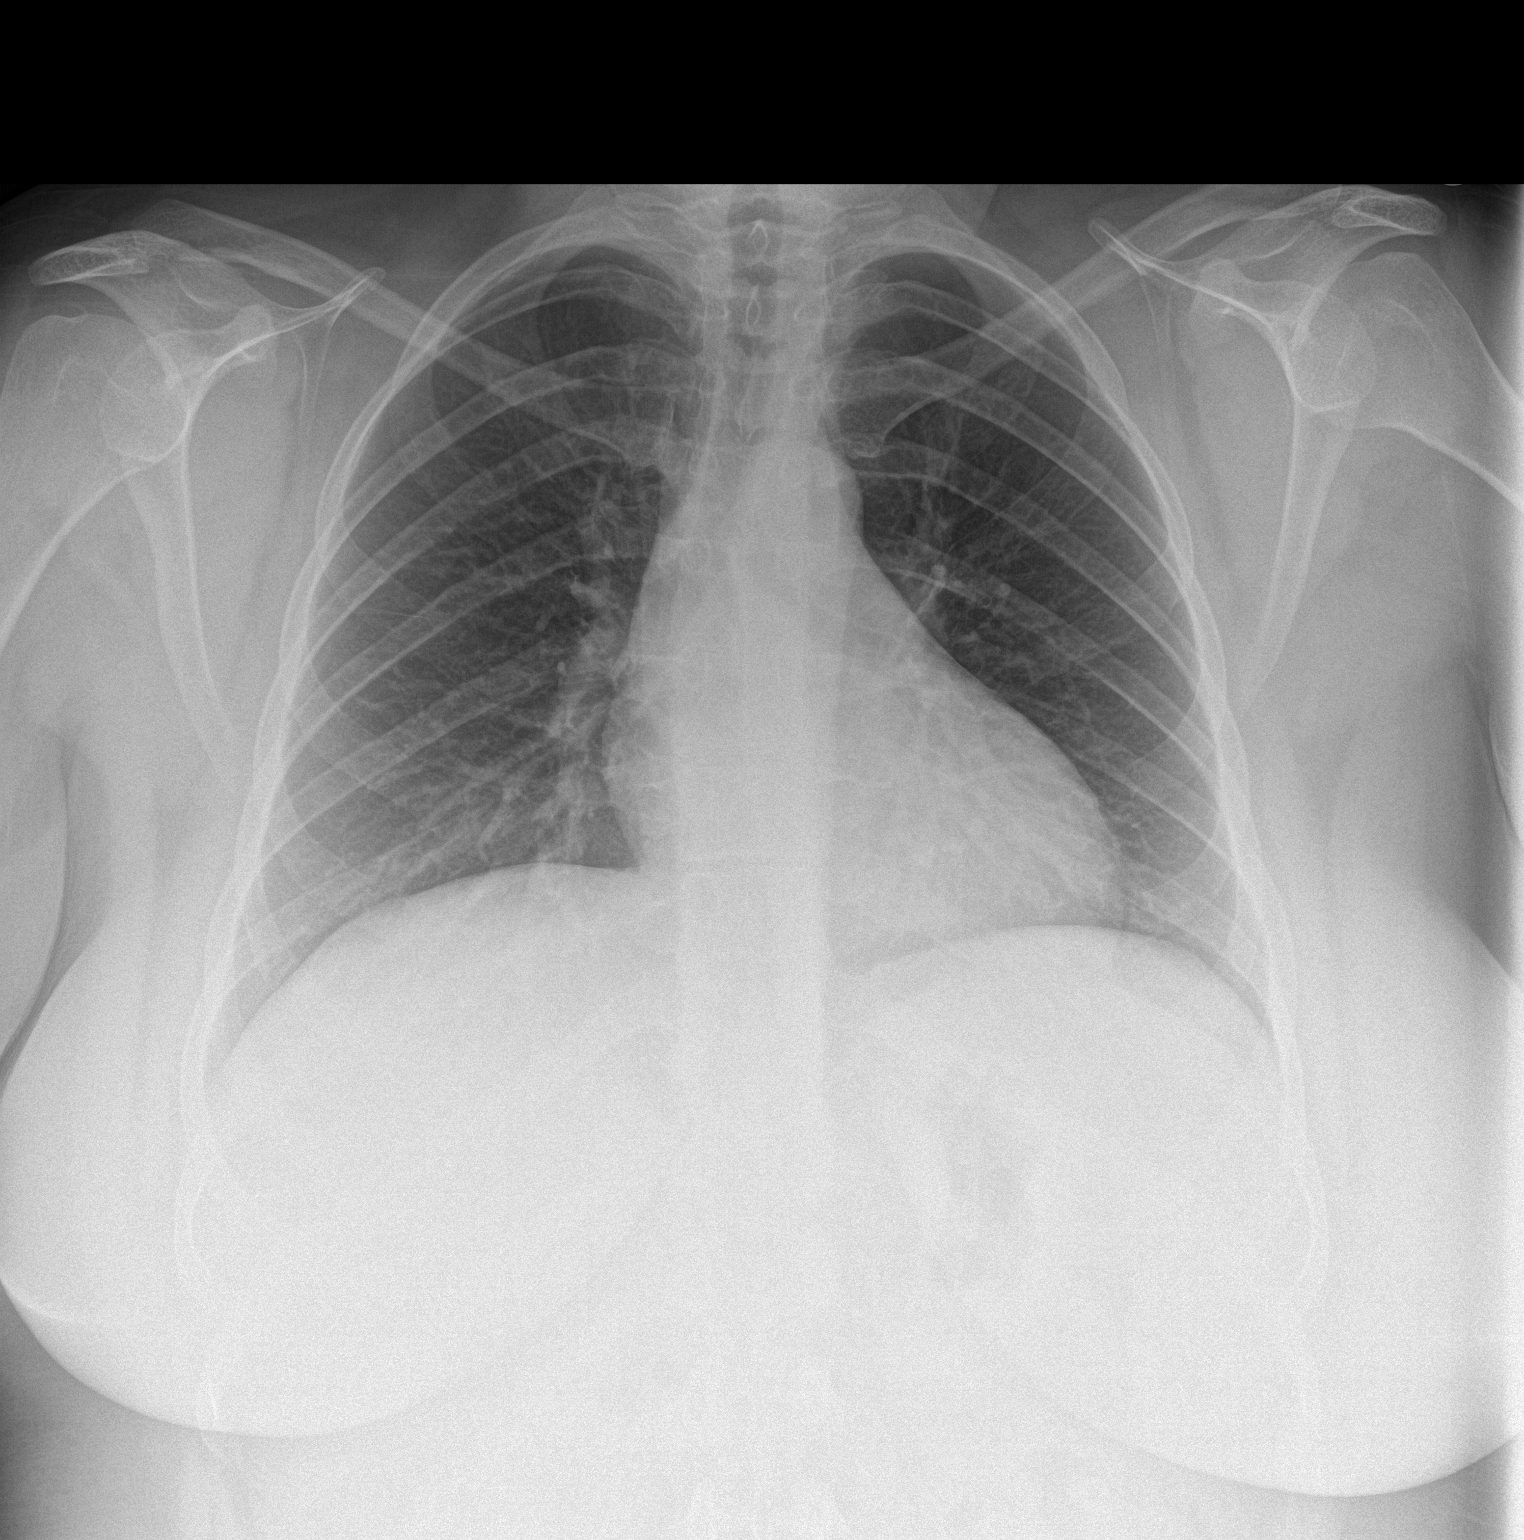
[im 2/2]
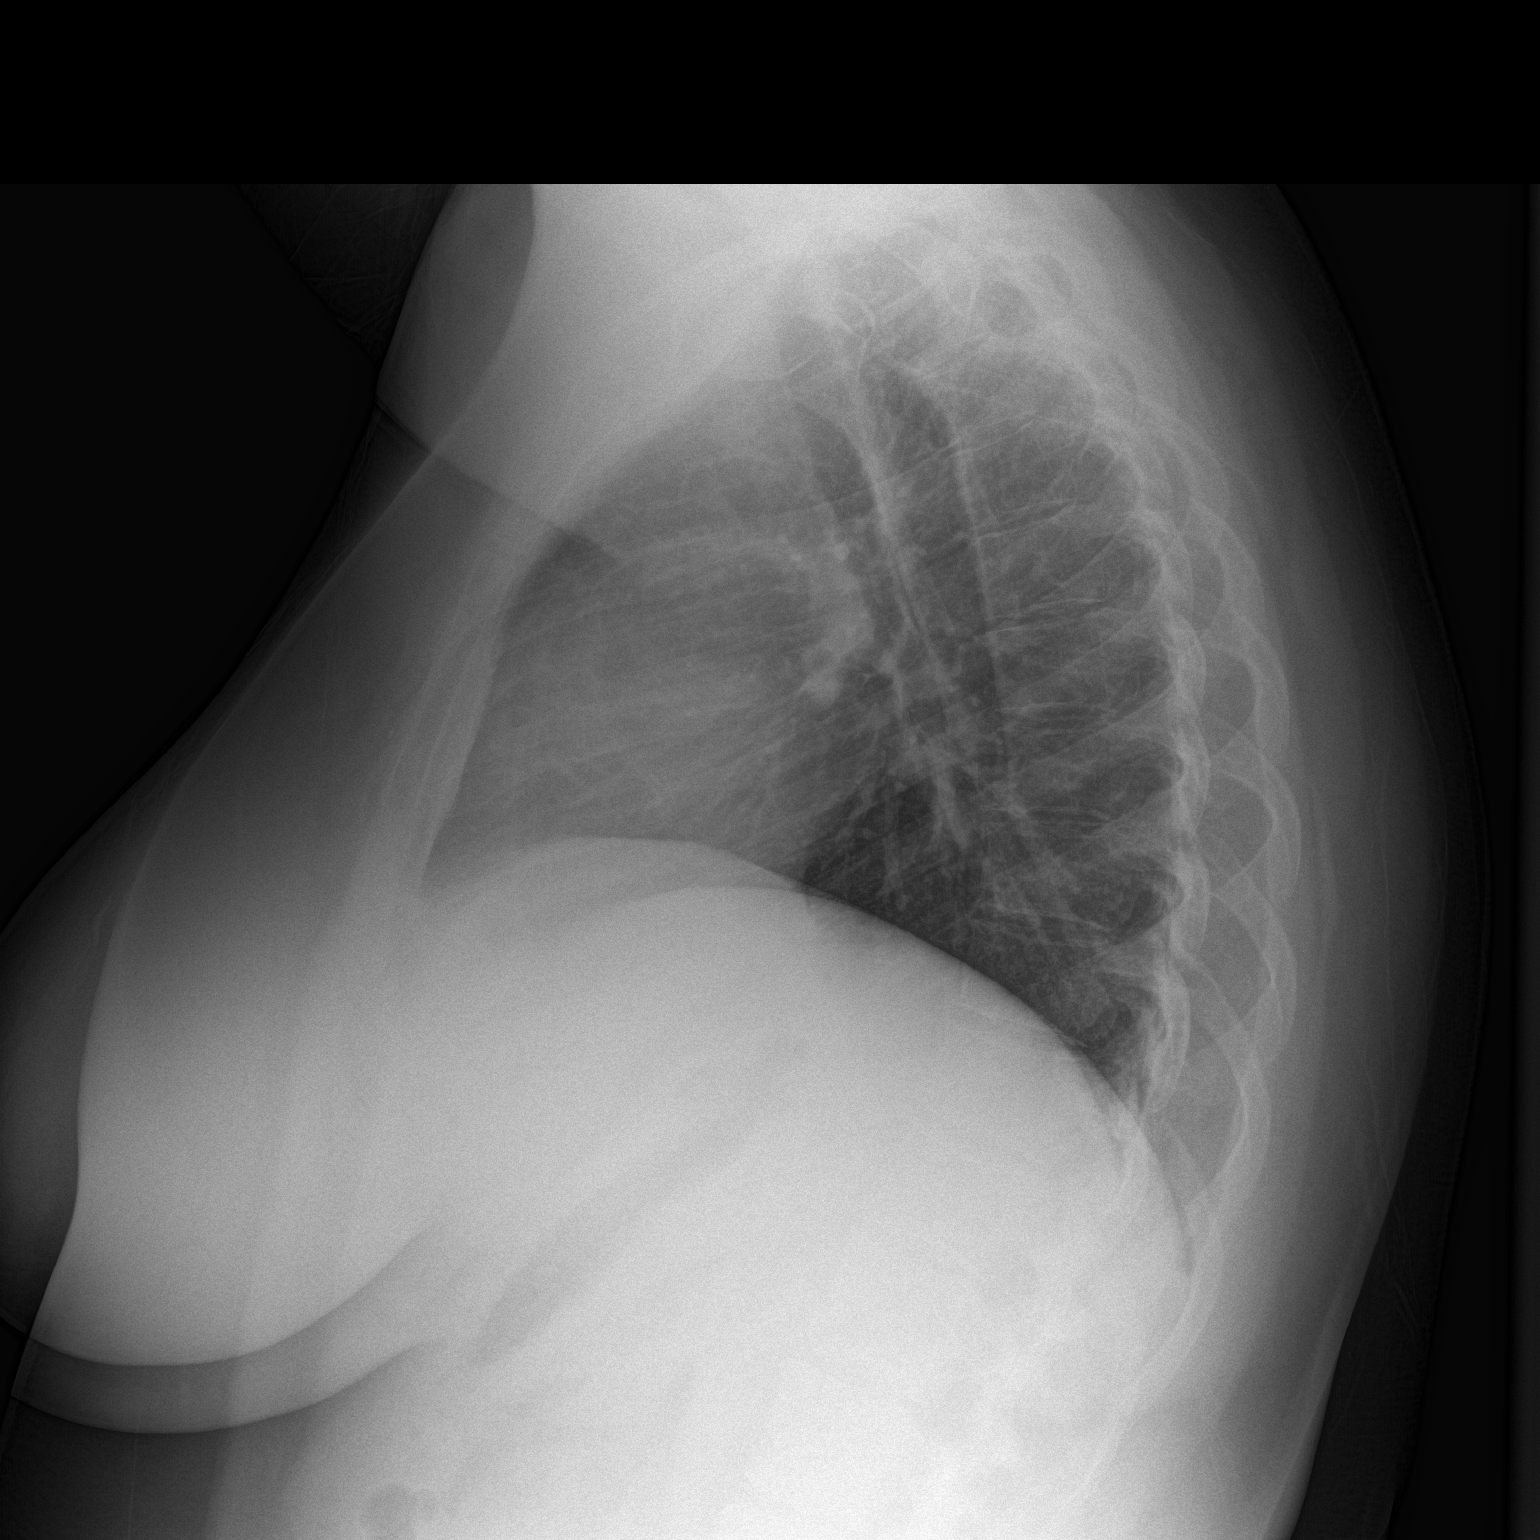

[2 of 2 positions shown; findings below may reference images not displayed]

FINDINGS: The heart size and mediastinal contours are within normal limits.
Both lungs are clear. The visualized skeletal structures are
unremarkable.
IMPRESSION: No active cardiopulmonary disease.

## 2017-11-05 ENCOUNTER — Other Ambulatory Visit (HOSPITAL_COMMUNITY)
Admission: RE | Admit: 2017-11-05 | Discharge: 2017-11-05 | Disposition: A | Discharge status: Home / Self Care | Payer: BLUE CROSS/BLUE SHIELD | Source: Ambulatory Visit | Attending: Obstetrics and Gynecology | Admitting: Obstetrics and Gynecology

## 2017-11-05 ENCOUNTER — Encounter: Payer: Self-pay | Admitting: Obstetrics and Gynecology

## 2017-11-05 ENCOUNTER — Ambulatory Visit (INDEPENDENT_AMBULATORY_CARE_PROVIDER_SITE_OTHER): Payer: BLUE CROSS/BLUE SHIELD | Admitting: Obstetrics and Gynecology

## 2017-11-05 VITALS — BP 124/80 | HR 93 | Ht 68.0 in | Wt 257.0 lb

## 2017-11-05 DIAGNOSIS — Z124 Encounter for screening for malignant neoplasm of cervix: Secondary | ICD-10-CM | POA: Insufficient documentation

## 2017-11-05 DIAGNOSIS — Z01411 Encounter for gynecological examination (general) (routine) with abnormal findings: Secondary | ICD-10-CM | POA: Diagnosis not present

## 2017-11-05 DIAGNOSIS — B373 Candidiasis of vulva and vagina: Secondary | ICD-10-CM | POA: Diagnosis not present

## 2017-11-05 DIAGNOSIS — Z3041 Encounter for surveillance of contraceptive pills: Secondary | ICD-10-CM | POA: Diagnosis not present

## 2017-11-05 DIAGNOSIS — B3731 Acute candidiasis of vulva and vagina: Secondary | ICD-10-CM

## 2017-11-05 DIAGNOSIS — Z01419 Encounter for gynecological examination (general) (routine) without abnormal findings: Secondary | ICD-10-CM

## 2017-11-05 MED ORDER — CLOTRIMAZOLE-BETAMETHASONE 1-0.05 % EX CREA: TOPICAL_CREAM | CUTANEOUS | 0 refills | Status: DC

## 2017-11-05 MED ORDER — NORETHIN-ETH ESTRADIOL-FE 0.4-35 MG-MCG PO CHEW: 1.0000 | CHEWABLE_TABLET | Freq: Every day | ORAL | 3 refills | Status: DC

## 2017-11-05 NOTE — Progress Notes (Signed)
HPI:      [name] is a 27 y.o. No obstetric history on file. who LMP was Patient's last menstrual period was 10/15/2017 (exact date)., presents today for her annual examination.  Her menses are regular every 1-2 months, lasting 4 days.  Dysmenorrhea mild, occurring first 1-2 days of flow. She does not have intermenstrual bleeding.  Sex activity: never sexually active.  Last Pap: 05/22/16  Results were: no abnormalities  Hx of STDs: none   There is no FH of breast cancer. There is no FH of ovarian cancer. The patient does do self-breast exams.  Tobacco use: The patient denies current or previous tobacco use. Alcohol use: social drinker Exercise: moderately active  She does get adequate calcium and Vitamin D in her diet.  She has a hx of type 2 DM and followed by PCP. Has lost 25# and doing diet/exercise changes. Still has occas vaginal irritation.   Past Medical History:  Diagnosis Date  . Anxiety   . Pre-diabetes   . Type 2 diabetes mellitus ([place]) 05/23/2016    Past Surgical History:  Procedure Laterality Date  . CYSTECTOMY     BL fallopian tube cysts  . ESOPHAGOGASTRODUODENOSCOPY N/A 07/13/2014   Procedure: ESOPHAGOGASTRODUODENOSCOPY (EGD);  Surgeon: Hulen Luster, MD;  Location: [facility] ENDOSCOPY;  Service: Gastroenterology;  Laterality: N/A;  . SALPINGECTOMY     partial    Family History  Problem Relation Age of Onset  . Diabetes Unknown        pre-diabetic grandfather     ROS:  Review of Systems  Constitutional: Negative for fever, malaise/fatigue and weight loss.  HENT: Negative for congestion, ear pain and sinus pain.   Respiratory: Negative for cough, shortness of breath and wheezing.   Cardiovascular: Negative for chest pain, orthopnea and leg swelling.  Gastrointestinal: Negative for constipation, diarrhea, nausea and vomiting.  Genitourinary: Negative for dysuria, frequency, hematuria and urgency.       Breast ROS: negative   Musculoskeletal:  Negative for back pain, joint pain and myalgias.  Skin: Negative for itching and rash.  Neurological: Negative for dizziness, tingling, focal weakness and headaches.  Endo/Heme/Allergies: Negative for environmental allergies. Does not bruise/bleed easily.  Psychiatric/Behavioral: Negative for depression and suicidal ideas. The patient is not nervous/anxious and does not have insomnia.     Objective: BP 124/80   Pulse 93   Ht 5\' 8"  (1.727 m)   Wt 257 lb (116.6 kg)   LMP 10/15/2017 (Exact Date)   BMI 39.08 kg/m    Physical Exam  Constitutional: She is oriented to person, place, and time. She appears well-developed and well-nourished.  Genitourinary: Vagina normal and uterus normal.  There is rash on the right labia.  There is rash on the left labia. No erythema or tenderness in the vagina. No vaginal discharge found. Right adnexum does not display mass and does not display tenderness. Left adnexum does not display mass and does not display tenderness. Cervix does not exhibit motion tenderness or polyp. Uterus is not enlarged or tender.  Neck: Normal range of motion. No thyromegaly present.  Cardiovascular: Normal rate, regular rhythm and normal heart sounds.  No murmur heard. Pulmonary/Chest: Effort normal and breath sounds normal. Right breast exhibits no mass, no nipple discharge, no skin change and no tenderness. Left breast exhibits no mass, no nipple discharge, no skin change and no tenderness.  Abdominal: Soft. There is no tenderness. There is no guarding.  Musculoskeletal: Normal range of motion.  Neurological: She is alert and oriented to person, place, and time. No cranial nerve deficit.  Psychiatric: She has a normal mood and affect. Her behavior is normal.  Vitals reviewed.   Assessment/Plan: Encounter for annual routine gynecological examination  Cervical cancer screening - Plan: Cytology - PAP  Encounter for surveillance of contraceptive pills - OCP RF to mail order.  - Plan: Norethin-Eth Estradiol-Fe [facility] FE,WYMZYA Orrin Brigham) 0.4-35 MG-MCG tablet  Candidal vaginitis - Rx diflucan/clotrimazole-betamethasone crm. F/u prn.  - Plan: clotrimazole-betamethasone (LOTRISONE) cream   Meds ordered this encounter  Medications  . clotrimazole-betamethasone (LOTRISONE) cream    Sig: Apply externally BID prn sx up to 2 wks    Dispense:  45 g    Refill:  0    Order Specific Question:   Supervising Provider    Answer:   Gae Dry [id number]  . Norethin-Eth Estradiol-Fe [facility] FE,WYMZYA FE,ZENCHENT FE,ZEOSA) 0.4-35 MG-MCG tablet    Sig: Chew 1 tablet by mouth daily.    Dispense:  3 Package    Refill:  3    Order Specific Question:   Supervising Provider    Answer:   Gae Dry [[id number]]    GYN counsel adequate intake of calcium and vitamin D, diet and exercise     F/U  Return in about 1 year (around 11/06/2018).  Annette Yan B. Yovana Scogin, PA-C 11/05/2017 2:54 PM

## 2017-11-05 NOTE — Patient Instructions (Signed)
I value your feedback and entrusting us with your care. If you get a [place] patient survey, I would appreciate you taking the time to let us know about your experience today. Thank you! 

## 2017-11-06 LAB — CYTOLOGY - PAP
ADEQUACY: ABSENT
DIAGNOSIS: NEGATIVE

## 2017-11-27 ENCOUNTER — Telehealth: Payer: Self-pay

## 2017-11-27 NOTE — Telephone Encounter (Signed)
Pt states she just had her annual visit w/[facility]. She is experiencing some heavier than normal for her bleeding. She is soaking through a regular to super size tampon onto a pad every 4 hours. Pt inquiring if she needs to be evaluated. [phone]

## 2017-11-27 NOTE — Telephone Encounter (Signed)
Spoke w/pt. Advised of heavy bleeding protocol & need to contact us or report to ER if she is completely soaking a regular size pad and having to change more than one time an hour. Pt states she usually only has 1 period q 3 mos or 1 period 2 out of 3 months d/t her OCP. She has been on the same OCP since she was 15 and normally uses a light tampon for 8 hours. Notified life stressors can affect menses as the body responds to stress. Advised can monitor and if bothersome to her can contact [place] back to discuss other birth control options to help assist with menstrual flow. She will call back if this worsens/persists.

## 2017-11-27 NOTE — Telephone Encounter (Signed)
[place] given to pt by nurse.

## 2018-12-09 NOTE — Progress Notes (Signed)
Chief Complaint  Patient presents with  . Gynecologic Exam     HPI:      [name] is a 29 y.o. No obstetric history on file. who LMP was Patient's last menstrual period was 12/03/2018 (exact date)., presents today for her annual examination.  Her menses are regular every month, lasting 4-5 days, with smaller clots.  Dysmenorrhea mild, occurring first 1-2 days of flow. She does not have intermenstrual bleeding. Was on OCPs for cycle control due to hx of DUB in adolescence. Stopped them a few months ago and menses are normal. Is losing wt and has gone gluten free. Fine off BC for now. Also with newly diagnosed HTN.  Sex activity: never sexually active.  Last Pap: 11/05/17  Results were: no abnormalities  Hx of STDs: none  There is no FH of breast cancer. There is no FH of ovarian cancer. The patient does do self-breast exams.  Tobacco use: The patient denies current or previous tobacco use. Alcohol use: social drinker  No drug use. Exercise: moderately active  She does get adequate calcium but not Vitamin D in her diet. Hx of Vit D deficency in past.   She has a hx of type 2 DM and followed by PCP. Would like thyroid checked today to make sure not factor in attempted wt loss.   Past Medical History:  Diagnosis Date  . Anxiety   . Hypertension   . Pre-diabetes   . Type 2 diabetes mellitus ([place]) 05/23/2016    Past Surgical History:  Procedure Laterality Date  . CYSTECTOMY     BL fallopian tube cysts  . ESOPHAGOGASTRODUODENOSCOPY N/A 07/13/2014   Procedure: ESOPHAGOGASTRODUODENOSCOPY (EGD);  Surgeon: Hulen Luster, MD;  Location: [facility] ENDOSCOPY;  Service: Gastroenterology;  Laterality: N/A;  . SALPINGECTOMY     partial    Family History  Problem Relation Age of Onset  . Diabetes Other        pre-diabetic grandfather  . Hypertension Mother   . Hypertension Father   . Breast cancer Neg Hx   . Ovarian cancer Neg Hx      ROS:  Review of Systems   Constitutional: Negative for fever, malaise/fatigue and weight loss.  HENT: Negative for congestion, ear pain and sinus pain.   Respiratory: Negative for cough, shortness of breath and wheezing.   Cardiovascular: Negative for chest pain, orthopnea and leg swelling.  Gastrointestinal: Negative for constipation, diarrhea, nausea and vomiting.  Genitourinary: Negative for dysuria, frequency, hematuria and urgency.       Breast ROS: negative   Musculoskeletal: Negative for back pain, joint pain and myalgias.  Skin: Negative for itching and rash.  Neurological: Negative for dizziness, tingling, focal weakness and headaches.  Endo/Heme/Allergies: Negative for environmental allergies. Does not bruise/bleed easily.  Psychiatric/Behavioral: Negative for depression and suicidal ideas. The patient is not nervous/anxious and does not have insomnia.     Objective: BP 118/80   Ht 5' 8.5" (1.74 m)   Wt 257 lb (116.6 kg)   LMP 12/03/2018 (Exact Date)   BMI 38.51 kg/m    Physical Exam Constitutional:      Appearance: She is well-developed.  Neck:     Musculoskeletal: Normal range of motion.     Thyroid: No thyromegaly.  Cardiovascular:     Rate and Rhythm: Normal rate and regular rhythm.     Heart sounds: Normal heart sounds. No murmur.  Pulmonary:     Effort: Pulmonary effort is normal.  Breath sounds: Normal breath sounds.  Chest:     Breasts:        Right: No mass, nipple discharge, skin change or tenderness.        Left: No mass, nipple discharge, skin change or tenderness.  Abdominal:     Palpations: Abdomen is soft.     Tenderness: There is no abdominal tenderness. There is no guarding.  Musculoskeletal: Normal range of motion.  Neurological:     General: No focal deficit present.     Mental Status: She is alert and oriented to person, place, and time.     Cranial Nerves: No cranial nerve deficit.  Skin:    General: Skin is warm and dry.  Psychiatric:        Mood and  Affect: Mood normal.        Behavior: Behavior normal.        Thought Content: Thought content normal.        Judgment: Judgment normal.  Vitals signs reviewed.   GYN EXAM DEFERRED SINCE NEVER SEX ACTIVE/NEG LAST YR  Assessment/Plan: Encounter for annual routine gynecological examination  Encounter for other general counseling or advice on contraception; pt stopped OCPs since not sex active. Having regular menses. F/u prn. If [facility], needs to be prog only due to new dx HTN.  Thyroid disorder screening - Plan: TSH + free T4; will call with results.  No orders of the defined types were placed in this encounter.   GYN counsel adequate intake of calcium and vitamin D, diet and exercise     F/U  Return in about 1 year (around 12/10/2019).  Stefanie Hodgens B. Othman Masur, PA-C 12/10/2018 10:49 AM

## 2018-12-10 ENCOUNTER — Encounter: Payer: Self-pay | Admitting: Obstetrics and Gynecology

## 2018-12-10 ENCOUNTER — Other Ambulatory Visit: Payer: Self-pay

## 2018-12-10 ENCOUNTER — Ambulatory Visit (INDEPENDENT_AMBULATORY_CARE_PROVIDER_SITE_OTHER): Payer: BLUE CROSS/BLUE SHIELD | Admitting: Obstetrics and Gynecology

## 2018-12-10 VITALS — BP 118/80 | Ht 68.5 in | Wt 257.0 lb

## 2018-12-10 DIAGNOSIS — Z01419 Encounter for gynecological examination (general) (routine) without abnormal findings: Secondary | ICD-10-CM | POA: Diagnosis not present

## 2018-12-10 DIAGNOSIS — Z3009 Encounter for other general counseling and advice on contraception: Secondary | ICD-10-CM

## 2018-12-10 DIAGNOSIS — Z1329 Encounter for screening for other suspected endocrine disorder: Secondary | ICD-10-CM

## 2018-12-10 NOTE — Patient Instructions (Signed)
I value your feedback and entrusting us with your care. If you get a [place] patient survey, I would appreciate you taking the time to let us know about your experience today. Thank you! 

## 2018-12-11 LAB — TSH+FREE T4
Free T4: 1.01 ng/dL (ref 0.82–1.77)
TSH: 1.98 u[IU]/mL (ref 0.450–4.500)

## 2021-06-14 ENCOUNTER — Encounter: Payer: Self-pay | Admitting: Obstetrics and Gynecology

## 2021-06-14 ENCOUNTER — Ambulatory Visit (INDEPENDENT_AMBULATORY_CARE_PROVIDER_SITE_OTHER): Payer: BC Managed Care – PPO | Admitting: Obstetrics and Gynecology

## 2021-06-14 ENCOUNTER — Other Ambulatory Visit (HOSPITAL_COMMUNITY)
Admission: RE | Admit: 2021-06-14 | Discharge: 2021-06-14 | Disposition: A | Discharge status: Home / Self Care | Payer: BLUE CROSS/BLUE SHIELD | Source: Ambulatory Visit | Attending: Obstetrics and Gynecology | Admitting: Obstetrics and Gynecology

## 2021-06-14 VITALS — BP 120/80 | Ht 68.5 in | Wt 245.0 lb

## 2021-06-14 DIAGNOSIS — Z3169 Encounter for other general counseling and advice on procreation: Secondary | ICD-10-CM | POA: Diagnosis not present

## 2021-06-14 DIAGNOSIS — Z124 Encounter for screening for malignant neoplasm of cervix: Secondary | ICD-10-CM | POA: Diagnosis not present

## 2021-06-14 DIAGNOSIS — Z1151 Encounter for screening for human papillomavirus (HPV): Secondary | ICD-10-CM | POA: Insufficient documentation

## 2021-06-14 DIAGNOSIS — Z01419 Encounter for gynecological examination (general) (routine) without abnormal findings: Secondary | ICD-10-CM

## 2021-06-14 NOTE — Progress Notes (Signed)
? ?Chief Complaint  ?Patient presents with  ? Gynecologic Exam  ?  No concerns  ? ? ? ?HPI: ?     [name] is a 31 y.o. No obstetric history on file. who LMP was Patient's last menstrual period was 05/29/2021 (exact date)., presents today for her annual examination.  Her menses are regular every month, lasting 6-7 days off OCPs, with small clots, mod flow. Dysmenorrhea mild/mod off OCPs, improved with NSAIDs. She does not have intermenstrual bleeding. Was on OCPs for cycle control due to hx of DUB in adolescence. Doing well off BC.  ? ?Sex activity: never sexually active. Getting married 12/23; won't prevent pregnancy after wedding. ?Last Pap: 11/05/17  Results were: no abnormalities  ?Hx of STDs: none ? ?There is no FH of breast cancer. There is no FH of ovarian cancer. The patient does self-breast exams. Has some nipple discomfort with menses; other times as well. Does drink caffeine.  ? ?Tobacco use: The patient denies current or previous tobacco use. ?Alcohol use: social drinker  ?No drug use. ?Exercise: moderately active ? ?She does get adequate calcium and Vitamin D in her diet. Hx of Vit D deficency in past.  ? ?She has a hx of type 2 DM/HTN and followed by PCP.  ? ?Past Medical History:  ?Diagnosis Date  ? Anxiety   ? Hypertension   ? Pre-diabetes   ? Type 2 diabetes mellitus ([place]) 05/23/2016  ? ? ?Past Surgical History:  ?Procedure Laterality Date  ? CYSTECTOMY    ? BL fallopian tube cysts  ? ESOPHAGOGASTRODUODENOSCOPY N/A 07/13/2014  ? Procedure: ESOPHAGOGASTRODUODENOSCOPY (EGD);  Surgeon: Hulen Luster, MD;  Location: [facility] ENDOSCOPY;  Service: Gastroenterology;  Laterality: N/A;  ? SALPINGECTOMY    ? partial  ? ? ?Family History  ?Problem Relation Age of Onset  ? Stroke Mother   ? Hypertension Mother   ? Hypertension Father   ? Diabetes Other   ?     pre-diabetic grandfather  ? Breast cancer Neg Hx   ? Ovarian cancer Neg Hx   ? ? ? ?ROS: ? ?Review of Systems  ?Constitutional:  Negative for fever,  malaise/fatigue and weight loss.  ?HENT:  Negative for congestion, ear pain and sinus pain.   ?Respiratory:  Negative for cough, shortness of breath and wheezing.   ?Cardiovascular:  Negative for chest pain, orthopnea and leg swelling.  ?Gastrointestinal:  Positive for constipation. Negative for diarrhea, nausea and vomiting.  ?Genitourinary:  Negative for dysuria, frequency, hematuria and urgency.  ?     Breast ROS: negative ?  ?Musculoskeletal:  Negative for back pain, joint pain and myalgias.  ?Skin:  Negative for itching and rash.  ?Neurological:  Negative for dizziness, tingling, focal weakness and headaches.  ?Endo/Heme/Allergies:  Negative for environmental allergies. Does not bruise/bleed easily.  ?Psychiatric/Behavioral:  Negative for depression and suicidal ideas. The patient is not nervous/anxious and does not have insomnia.   ? ?Objective: ?BP 120/80   Ht 5' 8.5" (1.74 m)   Wt 245 lb (111.1 kg)   LMP 05/29/2021 (Exact Date)   BMI 36.71 kg/m?  ? ? ?Physical Exam ?Constitutional:   ?   Appearance: She is well-developed.  ?Genitourinary:  ?   Vulva normal.  ?   Right Labia: No rash, tenderness or lesions. ?   Left Labia: No tenderness, lesions or rash. ?   No vaginal discharge, erythema or tenderness.  ? ?   Right Adnexa: not tender and no mass present. ?  Left Adnexa: not tender and no mass present. ?   No cervical friability or polyp.  ?   Uterus is not enlarged or tender.  ?Breasts: ?   Right: No mass, nipple discharge, skin change or tenderness.  ?   Left: No mass, nipple discharge, skin change or tenderness.  ?Neck:  ?   Thyroid: No thyromegaly.  ?Cardiovascular:  ?   Rate and Rhythm: Normal rate and regular rhythm.  ?   Heart sounds: Normal heart sounds. No murmur heard. ?Pulmonary:  ?   Effort: Pulmonary effort is normal.  ?   Breath sounds: Normal breath sounds.  ?Abdominal:  ?   Palpations: Abdomen is soft.  ?   Tenderness: There is no abdominal tenderness. There is no guarding or rebound.   ?Musculoskeletal:     ?   General: Normal range of motion.  ?   Cervical back: Normal range of motion.  ?Lymphadenopathy:  ?   Cervical: No cervical adenopathy.  ?Neurological:  ?   General: No focal deficit present.  ?   Mental Status: She is alert and oriented to person, place, and time.  ?   Cranial Nerves: No cranial nerve deficit.  ?Skin: ?   General: Skin is warm and dry.  ?Psychiatric:     ?   Mood and Affect: Mood normal.     ?   Behavior: Behavior normal.     ?   Thought Content: Thought content normal.     ?   Judgment: Judgment normal.  ?Vitals reviewed.  ? ? ?Assessment/Plan: ?Encounter for annual routine gynecological examination ? ?Cervical cancer screening - Plan: Cytology - PAP ? ?Screening for HPV (human papillomavirus) - Plan: Cytology - PAP ? ?Pre-conception counseling--start PNVs 3 months before conception, f/u if no pregnancy after 6 months.  ? ? ?GYN counsel adequate intake of calcium and vitamin D, diet and exercise ? ? ?  F/U ? Return in about 1 year (around 06/15/2022). ? ?Evan Mackie B. Lonna Rabold, PA-C ?06/14/2021 ?3:08 PM  ?

## 2021-06-14 NOTE — Patient Instructions (Signed)
I value your feedback and you entrusting us with your care. If you get a [place] patient survey, I would appreciate you taking the time to let us know about your experience today. Thank you! ? ? ?

## 2021-06-16 LAB — CYTOLOGY - PAP
Comment: NEGATIVE
Diagnosis: NEGATIVE
High risk HPV: NEGATIVE

## 2021-08-27 DIAGNOSIS — D735 Infarction of spleen: Secondary | ICD-10-CM

## 2021-08-27 HISTORY — DX: Infarction of spleen: D73.5

## 2021-09-16 ENCOUNTER — Other Ambulatory Visit: Payer: Self-pay | Admitting: Internal Medicine

## 2021-09-16 DIAGNOSIS — R161 Splenomegaly, not elsewhere classified: Secondary | ICD-10-CM

## 2021-09-16 DIAGNOSIS — E669 Obesity, unspecified: Secondary | ICD-10-CM

## 2021-09-28 ENCOUNTER — Telehealth: Payer: Self-pay

## 2021-09-28 ENCOUNTER — Inpatient Hospital Stay: Payer: BC Managed Care – PPO | Attending: Internal Medicine | Admitting: Internal Medicine

## 2021-09-28 ENCOUNTER — Encounter: Payer: Self-pay | Admitting: Internal Medicine

## 2021-09-28 ENCOUNTER — Ambulatory Visit
Admission: RE | Admit: 2021-09-28 | Discharge: 2021-09-28 | Disposition: A | Discharge status: Home / Self Care | Payer: BC Managed Care – PPO | Source: Ambulatory Visit | Attending: Internal Medicine | Admitting: Internal Medicine

## 2021-09-28 ENCOUNTER — Inpatient Hospital Stay: Payer: BC Managed Care – PPO

## 2021-09-28 DIAGNOSIS — I10 Essential (primary) hypertension: Secondary | ICD-10-CM | POA: Insufficient documentation

## 2021-09-28 DIAGNOSIS — Z7901 Long term (current) use of anticoagulants: Secondary | ICD-10-CM | POA: Diagnosis not present

## 2021-09-28 DIAGNOSIS — R161 Splenomegaly, not elsewhere classified: Secondary | ICD-10-CM | POA: Insufficient documentation

## 2021-09-28 DIAGNOSIS — E1165 Type 2 diabetes mellitus with hyperglycemia: Secondary | ICD-10-CM | POA: Diagnosis not present

## 2021-09-28 DIAGNOSIS — Z79899 Other long term (current) drug therapy: Secondary | ICD-10-CM | POA: Insufficient documentation

## 2021-09-28 DIAGNOSIS — E669 Obesity, unspecified: Secondary | ICD-10-CM | POA: Diagnosis present

## 2021-09-28 DIAGNOSIS — Z7984 Long term (current) use of oral hypoglycemic drugs: Secondary | ICD-10-CM | POA: Diagnosis not present

## 2021-09-28 DIAGNOSIS — D735 Infarction of spleen: Secondary | ICD-10-CM | POA: Diagnosis present

## 2021-09-28 DIAGNOSIS — Z7982 Long term (current) use of aspirin: Secondary | ICD-10-CM | POA: Diagnosis not present

## 2021-09-28 MED ORDER — APIXABAN 5 MG PO TABS: 5.0000 mg | ORAL_TABLET | Freq: Two times a day (BID) | ORAL | 0 refills | Status: DC

## 2021-09-28 NOTE — Progress Notes (Signed)
[place] NOTE  Patient Care Team: Copland, Ginette Otto as PCP - General (Physician Assistant) Cammie Sickle, MD as Consulting Physician (Oncology)  CHIEF COMPLAINTS/PURPOSE OF CONSULTATION: Splenomegaly.   Oncology History   No history exists.   JUNE 27th, 2023[facility] ] Nonspecific hepatosplenomegaly with a splenic infarct; Dx: Infectious mononucleiosis  #   HISTORY OF PRESENTING ILLNESS: Patient ambulating independently.  Patient is alone.  Annette Leon 31 y.o.  female with a history of obesity; diabetic has been referred to [place] for further evaluation recommendations for splenomegaly/splenic infarct.  In end of June 2023 patient diagnosed with infectious mononucleosis.  Patient noted to have abdominal pain left upper quadrant.  To further imaging with a CT scan-that showed splenic infarct.  Patient was recommended anticoagulation for about 3 months-however because of miscommunication-patient never received the medication.  She continued to be on aspirin 325 mg a day.  Patient also has significant movement of the pain however not currently resolved.  She does complain of stabbing pain intermittently.  Patient denies a personal history of blood clots.  Endorses family history of blood clots including grandmother and mother.  Review of Systems  Constitutional:  Positive for malaise/fatigue. Negative for chills, diaphoresis, fever and weight loss.  HENT:  Negative for nosebleeds and sore throat.   Eyes:  Negative for double vision.  Respiratory:  Negative for cough, hemoptysis, sputum production, shortness of breath and wheezing.   Cardiovascular:  Negative for chest pain, palpitations, orthopnea and leg swelling.  Gastrointestinal:  Negative for abdominal pain, blood in stool, constipation, diarrhea, heartburn, melena, nausea and vomiting.  Genitourinary:  Negative for dysuria, frequency and urgency.  Musculoskeletal:  Negative for back pain  and joint pain.  Skin: Negative.  Negative for itching and rash.  Neurological:  Negative for dizziness, tingling, focal weakness, weakness and headaches.  Endo/Heme/Allergies:  Does not bruise/bleed easily.  Psychiatric/Behavioral:  Negative for depression. The patient is not nervous/anxious and does not have insomnia.     MEDICAL HISTORY:  Past Medical History:  Diagnosis Date   Anxiety    Hypertension    Pre-diabetes    Type 2 diabetes mellitus ([place]) 05/23/2016    SURGICAL HISTORY: Past Surgical History:  Procedure Laterality Date   CYSTECTOMY     BL fallopian tube cysts   ESOPHAGOGASTRODUODENOSCOPY N/A 07/13/2014   Procedure: ESOPHAGOGASTRODUODENOSCOPY (EGD);  Surgeon: Hulen Luster, MD;  [facility]: [facility] ENDOSCOPY;  Service: Gastroenterology;  Laterality: N/A;   SALPINGECTOMY     partial    SOCIAL HISTORY: Social History   Socioeconomic History   Marital status: Single    Spouse name: Not on file   Number of children: Not on file   Years of education: Not on file   Highest education level: Not on file  Occupational History   Occupation: nurse  Tobacco Use   Smoking status: Never   Smokeless tobacco: Never  Vaping Use   Vaping Use: Never used  Substance and Sexual Activity   Alcohol use: Yes    Alcohol/week: 0.0 standard drinks of alcohol    Comment: social drinker   Drug use: No   Sexual activity: Not Currently    Birth control/protection: None  Other Topics Concern   Not on file  Social History Narrative   Not on file   Social Determinants of Health   Financial Resource Strain: Not on file  Food Insecurity: Not on file  Transportation Needs: Not on file  Physical Activity:  Not on file  Stress: Not on file  Social Connections: Not on file  Intimate Partner Violence: Not on file    FAMILY HISTORY: Family History  Problem Relation Age of Onset   Stroke Mother    Hypertension Mother    Hypertension Father    Diabetes Sister    Clotting disorder  Maternal Grandmother    Atrial fibrillation Maternal Grandmother    Diabetes Other        pre-diabetic grandfather   Breast cancer Neg Hx    Ovarian cancer Neg Hx     ALLERGIES:  is allergic to latex, amoxicillin, cephalosporins, penicillin g, and penicillins.  MEDICATIONS:  Current Outpatient Medications  Medication Sig Dispense Refill   apixaban (ELIQUIS) 5 MG TABS tablet Take 1 tablet (5 mg total) by mouth 2 (two) times daily. 60 tablet 0   Blood Glucose Monitoring Suppl (ONETOUCH VERIO FLEX SYSTEM) w/Device KIT      FARXIGA 10 MG TABS tablet Take 10 mg by mouth daily.     glimepiride (AMARYL) 4 MG tablet Take 4 mg by mouth daily. Take every other day     Lancets (ONETOUCH DELICA PLUS [id number]) MISC      metFORMIN (GLUCOPHAGE) 1000 MG tablet Take 1,000 mg by mouth 2 (two) times daily.     ONETOUCH VERIO test strip 1 each 3 (three) times daily.     propranolol ER (INDERAL LA) 60 MG 24 hr capsule Take 60 mg by mouth daily.     Semaglutide, 2 MG/DOSE, 8 MG/3ML SOPN Inject into the skin.     hydrochlorothiazide (HYDRODIURIL) 12.5 MG tablet Take by mouth.     losartan (COZAAR) 100 MG tablet Take by mouth.     No current facility-administered medications for this visit.    PHYSICAL EXAMINATION: ECOG PERFORMANCE STATUS: 0 - Asymptomatic  Vitals:   09/28/21 1102  BP: (!) 147/106  Pulse: 87  Temp: (!) 96.7 F (35.9 C)  SpO2: 97%   Filed Weights   09/28/21 1102  Weight: 241 lb 3.2 oz (109.4 kg)   Mild tenderness in the left upper quadrant.  Physical Exam Vitals and nursing note reviewed.  HENT:     Head: Normocephalic and atraumatic.     Mouth/Throat:     Pharynx: Oropharynx is clear.  Eyes:     Extraocular Movements: Extraocular movements intact.     Pupils: Pupils are equal, round, and reactive to light.  Cardiovascular:     Rate and Rhythm: Normal rate and regular rhythm.  Pulmonary:     Comments: Decreased breath sounds bilaterally.  Abdominal:     Palpations:  Abdomen is soft.  Musculoskeletal:        General: Normal range of motion.     Cervical back: Normal range of motion.  Skin:    General: Skin is warm.  Neurological:     General: No focal deficit present.     Mental Status: She is alert and oriented to person, place, and time.  Psychiatric:        Behavior: Behavior normal.        Judgment: Judgment normal.     LABORATORY DATA:  I have reviewed the data as listed Lab Results  Component Value Date   WBC 10.8 09/21/2014   HGB 13.6 09/21/2014   HCT 40.4 09/21/2014   MCV 87.5 09/21/2014   PLT 239 09/21/2014   No results for input(s): "NA", "K", "CL", "CO2", "GLUCOSE", "BUN", "CREATININE", "CALCIUM", "GFRNONAA", "GFRAA", "PROT", "ALBUMIN", "AST", "ALT", "  ALKPHOS", "BILITOT", "BILIDIR", "IBILI" in the last 8760 hours.  RADIOGRAPHIC STUDIES: I have personally reviewed the radiological images as listed and agreed with the findings in the report. US Abdomen Complete  Result Date: 09/28/2021 CLINICAL DATA:  Splenomegaly. EXAM: ABDOMEN ULTRASOUND COMPLETE COMPARISON:  CT abdomen pelvis Jul 11, 2014 FINDINGS: Gallbladder: No wall thickening visualized. There is questioned minimal sludge in the gallbladder. No sonographic Murphy sign noted by sonographer. Common bile duct: Diameter: 4.5 mm Liver: No focal lesion identified. Diffuse increased echotexture of liver noted. Portal vein is patent on color Doppler imaging with normal direction of blood flow towards the liver. IVC: No abnormality visualized. Pancreas: Limited visualization due to overlying bowel gas per ultrasound technologist. Spleen: 14.1 cm, volume 567.3. Right Kidney: Length: 14.4 cm. Echogenicity within normal limits. No mass or hydronephrosis visualized. Left Kidney: Length: 14.9 cm. Echogenicity within normal limits. No mass or hydronephrosis visualized. Abdominal aorta: No aneurysm visualized. Other findings: None. IMPRESSION: 1. Mild splenomegaly. 2. Fatty infiltration of liver.  Electronically Signed   By: Abelardo Diesel M.D.   On: 09/28/2021 09:00     Splenomegaly      Splenic infarct # JUNE 27th, 2023-splenic infarct-provoked [in the context of splenomegaly from underlying infectious mononucleosis].  Patient given aspirin _0 mg a day.  Overall significant improvement noted however pain not completely resolved.    #I reviewed the lack of data in treating splenic infarcts there were contacts.  However since provoked I think it is reasonable to consider anticoagulation for about a month/especially pain is improved but not completely resolved.   #Etiology: Suspect splenomegaly/infectious mononucleosis.  However discussed regarding checking genetic susceptibilities.  Patient's mother and grandmother have blood clots.  Patient will speak to her insurance before having it drawn.   # Obesity/DM-July 2023-hemoglobin A1c 10.3-poorly controlled diabetes/defer to PCP.  Thank you Dr.Sparks for allowing me to participate in the care of your pleasant patient. Please do not hesitate to contact me with questions or concerns in the interim.  # DISPOSITION: # no labs today # follow up in 1 month; MD; lab appt after MD visit- dr.B  Cc; D.Sparks      # PAIN CONTROL  # GENETICS:  # CLINICAL TRIALS:  # DISPOSITION: # labs today- ordered # Follow up-   Thank you Dr. for allowing me to participate in the care of your pleasant patient. Please do not hesitate to contact me with questions or concerns in the interim.  Above plan of care was discussed with patient/family in detail.  My contact information was given to the patient/family.     Cammie Sickle, MD 09/28/2021 1:11 PM

## 2021-09-28 NOTE — Assessment & Plan Note (Addendum)
#   JUNE 27th, 2023-splenic infarct-provoked [in the context of splenomegaly from underlying infectious mononucleosis].  Patient given aspirin '3 2 5 '$ mg a day.  Overall significant improvement noted however pain not completely resolved.    #I reviewed the lack of data in treating splenic infarcts there were contacts.  However since provoked I think it is reasonable to consider anticoagulation for about a month/especially pain is improved but not completely resolved.   #Etiology: Suspect splenomegaly/infectious mononucleosis.  However discussed regarding checking genetic susceptibilities.  Patient's mother and grandmother have blood clots.  Patient will speak to her insurance before having it drawn.   # Obesity/DM-July 2023-hemoglobin A1c 10.3-poorly controlled diabetes/defer to PCP.  Thank you Dr.Sparks for allowing me to participate in the care of your pleasant patient. Please do not hesitate to contact me with questions or concerns in the interim.  # DISPOSITION: # no labs today # follow up in 1 month; MD; lab appt after MD visit- dr.B  Cc; D.Sparks

## 2021-09-28 NOTE — Telephone Encounter (Signed)
Approved today-Eliquis POIPPG:[id number];YOFVWA:[id number];Review Type:Prior Auth;Coverage Start Date:08/29/2021;Coverage End Date:03/27/2022;

## 2021-11-03 ENCOUNTER — Inpatient Hospital Stay: Payer: BC Managed Care – PPO

## 2021-11-03 ENCOUNTER — Inpatient Hospital Stay: Payer: BC Managed Care – PPO | Attending: Internal Medicine | Admitting: Internal Medicine

## 2021-11-10 ENCOUNTER — Other Ambulatory Visit: Payer: Self-pay | Admitting: Internal Medicine

## 2021-11-17 NOTE — Telephone Encounter (Signed)
Pt scheduled for lab/MD on 12/07/21.

## 2021-12-07 ENCOUNTER — Telehealth: Payer: Self-pay | Admitting: Internal Medicine

## 2021-12-07 ENCOUNTER — Inpatient Hospital Stay: Payer: BC Managed Care – PPO

## 2021-12-07 ENCOUNTER — Inpatient Hospital Stay: Payer: BC Managed Care – PPO | Attending: Internal Medicine | Admitting: Internal Medicine

## 2021-12-07 NOTE — Assessment & Plan Note (Deleted)
#   JUNE 27th, 2023-splenic infarct-provoked [in the context of splenomegaly from underlying infectious mononucleosis].  Patient given aspirin '3 2 5 '$ mg a day.  Overall significant improvement noted however pain not completely resolved.    #I reviewed the lack of data in treating splenic infarcts there were contacts.  However since provoked I think it is reasonable to consider anticoagulation for about a month/especially pain is improved but not completely resolved.   #Etiology: Suspect splenomegaly/infectious mononucleosis.  However discussed regarding checking genetic susceptibilities.  Patient's mother and grandmother have blood clots.  Patient will speak to her insurance before having it drawn.   # Obesity/DM-July 2023-hemoglobin A1c 10.3-poorly controlled diabetes/defer to PCP.  Thank you Dr.Sparks for allowing me to participate in the care of your pleasant patient. Please do not hesitate to contact me with questions or concerns in the interim.  # DISPOSITION: # no labs today # follow up in 1 month; MD; lab appt after MD visit- dr.B  Cc; D.Sparks

## 2021-12-07 NOTE — Telephone Encounter (Signed)
Pt no showed to 12/07/21 appt. I left VM to reschedule and mychart message as well.

## 2022-03-09 ENCOUNTER — Telehealth: Payer: Self-pay

## 2022-03-09 NOTE — Telephone Encounter (Signed)
If sexually active, pt to do UPT. If not, let's follow cycle. F/u prn heavy bleeding protocols.

## 2022-03-09 NOTE — Telephone Encounter (Signed)
Started her cycle yesterday. Has been having large blood clots. She is using a super plus tampon and she is having to change tampon every 2 hours. Clots are bigger than they normally are. Bleeding is gradually getting better. Please advise.

## 2022-03-10 NOTE — Telephone Encounter (Signed)
Patient was advised, call was transferred to the front office for patient to schedule appointment. KW

## 2022-03-15 NOTE — Progress Notes (Signed)
Raeden Belzer, Deirdre Evener, PA-C   Chief Complaint  Patient presents with   Vaginal Bleeding    Heavy vag bleeding, a lot of clotting, neg UPT last week, right side pelvic pain    HPI:      [name] is a 32 y.o. G0P0000 whose LMP was Patient's last menstrual period was 03/08/2022 (exact date)., presents today for AUB this cycle. Period started 3 days early this cycle, has been bleeding for 7 days now. First 2 days were excessively heavy, changing ultra tampon hourly with large clots. Next few days were her normal heavy flow, now getting lighter. Also with RLQ intermittent, sharp pains for first day of heavy flow, improved with motrin 800; then achy, now resolved. Normally has achy adnexal areas with menses.  Neg UPT last wk.  Her menses are usually regular every month, lasting 5-7 days off OCPs, with small clots, mod flow. Dysmenorrhea mild/mod off OCPs, improved with NSAIDs. She does not have intermenstrual bleeding. Was on OCPs for cycle control due to hx of DUB in adolescence. Pt had CT 6/23 that showed 2.8 cm RT adnexal cyst/follicle; fibroid lower anterior uterus. Pt got married 12/23, now sexually active.  Pt has had external vaginal itching; no increased d/c/odor. Hx of DM, sugars well controlled.   Patient Active Problem List   Diagnosis Date Noted   Leiomyoma 03/16/2022   Splenomegaly 09/28/2021   Splenic infarct 09/28/2021   Type 2 diabetes mellitus ([place]) 05/23/2016   Hyperlipidemia associated with type 2 diabetes mellitus ([place]) 05/23/2016   Hematemesis 07/11/2014   Intractable nausea and vomiting 07/11/2014   Urinary frequency 07/11/2014   Anxiety 07/11/2014    Past Surgical History:  Procedure Laterality Date   CYSTECTOMY     BL fallopian tube cysts   ESOPHAGOGASTRODUODENOSCOPY N/A 07/13/2014   Procedure: ESOPHAGOGASTRODUODENOSCOPY (EGD);  Surgeon: Hulen Luster, MD;  Location: [facility] ENDOSCOPY;  Service: Gastroenterology;  Laterality: N/A;   SALPINGECTOMY      partial    Family History  Problem Relation Age of Onset   Stroke Mother    Hypertension Mother    Hypertension Father    Diabetes Sister    Clotting disorder Maternal Grandmother    Atrial fibrillation Maternal Grandmother    Other Paternal Grandmother        Kidney Tumor   Diabetes Other        pre-diabetic grandfather   Breast cancer Neg Hx    Ovarian cancer Neg Hx     Social History   Socioeconomic History   Marital status: Single    Spouse name: Not on file   Number of children: Not on file   Years of education: Not on file   Highest education level: Not on file  Occupational History   Occupation: nurse  Tobacco Use   Smoking status: Never   Smokeless tobacco: Never  Vaping Use   Vaping Use: Never used  Substance and Sexual Activity   Alcohol use: Yes    Alcohol/week: 0.0 standard drinks of alcohol    Comment: social drinker   Drug use: No   Sexual activity: Yes    Birth control/protection: None  Other Topics Concern   Not on file  Social History Narrative   Not on file   Social Determinants of Health   Financial Resource Strain: Not on file  Food Insecurity: Not on file  Transportation Needs: Not on file  Physical Activity: Not on file  Stress: Not on file  Social Connections: Not on file  Intimate Partner Violence: Not on file    Outpatient Medications Prior to Visit  Medication Sig Dispense Refill   Blood Glucose Monitoring Suppl (ONETOUCH VERIO FLEX SYSTEM) w/Device KIT      FARXIGA 10 MG TABS tablet Take 10 mg by mouth daily.     glimepiride (AMARYL) 4 MG tablet Take 4 mg by mouth daily. Take every other day     Lancets (ONETOUCH DELICA PLUS [id number]) MISC      metFORMIN (GLUCOPHAGE) 1000 MG tablet Take 1,000 mg by mouth 2 (two) times daily.     ONETOUCH VERIO test strip 1 each 3 (three) times daily.     propranolol ER (INDERAL LA) 60 MG 24 hr capsule Take 60 mg by mouth daily.     Semaglutide, 2 MG/DOSE, 8 MG/3ML SOPN Inject into the skin.      hydrochlorothiazide (HYDRODIURIL) 12.5 MG tablet Take by mouth.     losartan (COZAAR) 100 MG tablet Take by mouth.     ELIQUIS 5 MG TABS tablet TAKE 1 TABLET(5 MG) BY MOUTH TWICE DAILY 60 tablet 0   No facility-administered medications prior to visit.      ROS:  Review of Systems  Constitutional:  Negative for fever.  Gastrointestinal:  Negative for blood in stool, constipation, diarrhea, nausea and vomiting.  Genitourinary:  Positive for menstrual problem and pelvic pain. Negative for dyspareunia, dysuria, flank pain, frequency, hematuria, urgency, vaginal bleeding, vaginal discharge and vaginal pain.  Musculoskeletal:  Negative for back pain.  Skin:  Negative for rash.   BREAST: No symptoms   OBJECTIVE:   Vitals:  BP 110/70   Ht '5\' 8"'$  (1.727 m)   Wt 244 lb (110.7 kg)   LMP 03/08/2022 (Exact Date)   BMI 37.10 kg/m   Physical Exam Vitals reviewed.  Constitutional:      Appearance: She is well-developed.  Pulmonary:     Effort: Pulmonary effort is normal.  Abdominal:     Palpations: Abdomen is soft.     Tenderness: There is no abdominal tenderness.  Genitourinary:    General: Normal vulva.     Pubic Area: No rash.      Labia:        Right: Rash present. No tenderness or lesion.        Left: Rash present. No tenderness or lesion.      Vagina: Bleeding present. No vaginal discharge, erythema or tenderness.     Cervix: Normal.     Uterus: Normal. Tender. Not enlarged.      Adnexa: Right adnexa normal.       Right: No mass or tenderness.         Left: Tenderness present. No mass.         Comments: BILAT LABIA MAJORA AND MINORA WITH ERYTHEMA, SCALE, HYPERTROPHY QUESTION LEIO LOWER LEFT ANTERIOR Musculoskeletal:        General: Normal range of motion.     Cervical back: Normal range of motion.  Skin:    General: Skin is warm and dry.  Neurological:     General: No focal deficit present.     Mental Status: She is alert and oriented to person, place, and  time.  Psychiatric:        Mood and Affect: Mood normal.        Behavior: Behavior normal.        Thought Content: Thought content normal.        Judgment: Judgment normal.  Results: Results for orders placed or performed in visit on 03/16/22 (from the past 24 hour(s))  POCT urine pregnancy     Status: Normal   Collection Time: 03/16/22 11:52 AM  Result Value Ref Range   Preg Test, Ur Negative Negative     Assessment/Plan: Abnormal uterine bleeding (AUB) - Plan: US PELVIS TRANSVAGINAL NON-OB (TV ONLY), POCT urine pregnancy; neg UPT, slightly tender on exam LLQ. Flow is getting lighter. Check GYN u/s given new hx of leio/exam findings. F/u prn. If sx recur next cycle, will eval further.   Leiomyoma - Plan: US PELVIS TRANSVAGINAL NON-OB (TV ONLY); pt gets nervous about vaginal probe for u/s. Rx xanax to take before procedure.   Acute vaginitis - Plan: fluconazole (DIFLUCAN) 150 MG tablet; pos exam. Rx diflucan. F/u prn. If sx persist, will do lotrisone crm Rx.    Meds ordered this encounter  Medications   fluconazole (DIFLUCAN) 150 MG tablet    Sig: Take 1 tablet (150 mg total) by mouth once for 1 dose. May repeat in 3 days if still having symptoms    Dispense:  2 tablet    Refill:  0    Order Specific Question:   Supervising Provider    Answer:   Rubie Maid [[id number]]   ALPRAZolam (XANAX) 0.5 MG tablet    Sig: Take 1 tablet 30 min before appt    Dispense:  2 tablet    Refill:  0    Order Specific Question:   Supervising Provider    Answer:   Rubie Maid [[id number]]      Return in about 4 weeks (around 04/13/2022) for GYN u/s for leio--[facility] to call pt.  Lindie Roberson B. Zaven Klemens, PA-C 03/16/2022 11:52 AM

## 2022-03-16 ENCOUNTER — Ambulatory Visit: Payer: BC Managed Care – PPO | Admitting: Obstetrics and Gynecology

## 2022-03-16 ENCOUNTER — Encounter: Payer: Self-pay | Admitting: Obstetrics and Gynecology

## 2022-03-16 VITALS — BP 110/70 | Ht 68.0 in | Wt 244.0 lb

## 2022-03-16 DIAGNOSIS — Z3202 Encounter for pregnancy test, result negative: Secondary | ICD-10-CM | POA: Diagnosis not present

## 2022-03-16 DIAGNOSIS — N760 Acute vaginitis: Secondary | ICD-10-CM | POA: Diagnosis not present

## 2022-03-16 DIAGNOSIS — D259 Leiomyoma of uterus, unspecified: Secondary | ICD-10-CM | POA: Diagnosis not present

## 2022-03-16 DIAGNOSIS — D219 Benign neoplasm of connective and other soft tissue, unspecified: Secondary | ICD-10-CM

## 2022-03-16 DIAGNOSIS — N939 Abnormal uterine and vaginal bleeding, unspecified: Secondary | ICD-10-CM | POA: Diagnosis not present

## 2022-03-16 LAB — POCT URINE PREGNANCY: Preg Test, Ur: NEGATIVE

## 2022-03-16 MED ORDER — FLUCONAZOLE 150 MG PO TABS: 150.0000 mg | ORAL_TABLET | Freq: Once | ORAL | 0 refills | Status: AC

## 2022-03-16 MED ORDER — ALPRAZOLAM 0.5 MG PO TABS: ORAL_TABLET | ORAL | 0 refills | Status: DC

## 2022-03-16 NOTE — Patient Instructions (Signed)
I value your feedback and you entrusting us with your care. If you get a [facility] patient survey, I would appreciate you taking the time to let us know about your experience today. Thank you! ? ? ?

## 2022-03-22 ENCOUNTER — Encounter: Payer: Self-pay | Admitting: Obstetrics and Gynecology

## 2022-03-22 ENCOUNTER — Other Ambulatory Visit: Payer: Self-pay | Admitting: Obstetrics and Gynecology

## 2022-03-22 DIAGNOSIS — N760 Acute vaginitis: Secondary | ICD-10-CM

## 2022-03-22 MED ORDER — CLOTRIMAZOLE-BETAMETHASONE 1-0.05 % EX CREA: TOPICAL_CREAM | CUTANEOUS | 0 refills | Status: DC

## 2022-03-22 NOTE — Progress Notes (Signed)
Rx lotrisone crm for continued ext vaginal itching/irritation, already treated with 2 diflucan.

## 2022-04-04 ENCOUNTER — Telehealth: Payer: Self-pay | Admitting: Obstetrics and Gynecology

## 2022-04-04 NOTE — Telephone Encounter (Signed)
I contacted patient leaving detailed message. I left new date, location, time full bladder. Patient is scheduled for [facility] on 2/14 at 3:15 pm. I Advised patient if this didn't work for her to contacted centralized scheduled or our office so rescheduled. My chart message to be sent.

## 2022-04-05 NOTE — Telephone Encounter (Signed)
I contacted patient via phone. I left message for patient to call back to confirm and to log into mychart for new scheduled appoinment

## 2022-04-11 ENCOUNTER — Other Ambulatory Visit: Payer: BC Managed Care – PPO

## 2022-04-12 ENCOUNTER — Ambulatory Visit: Payer: BC Managed Care – PPO

## 2022-04-17 ENCOUNTER — Ambulatory Visit
Admission: RE | Admit: 2022-04-17 | Discharge: 2022-04-17 | Disposition: A | Discharge status: Home / Self Care | Payer: BC Managed Care – PPO | Source: Ambulatory Visit | Attending: Obstetrics and Gynecology | Admitting: Obstetrics and Gynecology

## 2022-04-17 DIAGNOSIS — D219 Benign neoplasm of connective and other soft tissue, unspecified: Secondary | ICD-10-CM | POA: Diagnosis present

## 2022-04-17 DIAGNOSIS — N939 Abnormal uterine and vaginal bleeding, unspecified: Secondary | ICD-10-CM | POA: Diagnosis not present

## 2022-04-18 ENCOUNTER — Encounter: Payer: Self-pay | Admitting: Obstetrics and Gynecology

## 2022-04-18 ENCOUNTER — Telehealth: Payer: Self-pay

## 2022-04-18 DIAGNOSIS — Z3169 Encounter for other general counseling and advice on procreation: Secondary | ICD-10-CM

## 2022-04-18 NOTE — Telephone Encounter (Signed)
Results received

## 2022-04-18 NOTE — Telephone Encounter (Signed)
Annette Leon with [facility] Radiology calling with report on 04/17/22 ultrasound:  IMPRESSION: Probable prolapsed endometrial polyp at the lower uterine segment, 2.1 cm greatest size extending into cervix; consider sonohysterogram for further evaluation, prior to hysteroscopy or endometrial biopsy.  Full report available in EPIC.

## 2022-04-20 NOTE — Telephone Encounter (Signed)
Spoke with pt. Having menses Q28 days, been sexually active for 1 yr without conception. Will do serum prog day 21 of cycles (March 11). Will do semen analysis for pt's husband, Iverson Alamin MRN: [id number] . Pt to then f/u wit Dr. Marcelline Mates when discussed prolapsed polyp.

## 2022-04-28 DIAGNOSIS — D259 Leiomyoma of uterus, unspecified: Secondary | ICD-10-CM

## 2022-04-28 HISTORY — DX: Leiomyoma of uterus, unspecified: D25.9

## 2022-05-08 ENCOUNTER — Other Ambulatory Visit: Payer: BC Managed Care – PPO

## 2022-05-08 DIAGNOSIS — Z3169 Encounter for other general counseling and advice on procreation: Secondary | ICD-10-CM

## 2022-05-09 LAB — PROGESTERONE: Progesterone: 1 ng/mL

## 2022-05-11 ENCOUNTER — Ambulatory Visit: Payer: BC Managed Care – PPO | Admitting: Obstetrics and Gynecology

## 2022-05-11 ENCOUNTER — Encounter: Payer: Self-pay | Admitting: Obstetrics and Gynecology

## 2022-05-11 VITALS — BP 144/106 | HR 90 | Resp 16 | Ht 68.5 in | Wt 246.0 lb

## 2022-05-11 DIAGNOSIS — N840 Polyp of corpus uteri: Secondary | ICD-10-CM

## 2022-05-11 DIAGNOSIS — N939 Abnormal uterine and vaginal bleeding, unspecified: Secondary | ICD-10-CM | POA: Diagnosis not present

## 2022-05-11 NOTE — Progress Notes (Signed)
GYNECOLOGY PROGRESS NOTE  Subjective:    Patient ID: Annette Leon, female    DOB: [birth year], 32 y.o.   MRN: [id number]  HPI  Patient is a 32 y.o. G0P0000 female who presents to discuss Hysteroscopy and D&C for abnormal uterine bleeding and uterine mass (polyp vs fibroid). She was referred by Ardeth Perfect, PA of this practice. She was last seen on 0000000 by Elmo Putt for abnormal uterine bleeding (very heavy menstrual cycle lasting ~ 10 days, had to use ultra pds changing every hour for Days 2-4 of the cycle). She had a pelvic ultrasound on 04/17/22 which noted an endometrial mass with stalk, .suspected prolapsed polyp. Patient notes that she recently got married 3 months ago, is having painful intercourse as she thinks it may sometimes prolapse through her cervix and her partner may be hitting it.  Also notes that they are thinking about starting a family soon, and worry that this may interfere with a pregnancy.   The following portions of the patient's history were reviewed and updated as appropriate:  She  has a past medical history of Anxiety, Hypertension, Pre-diabetes, and Type 2 diabetes mellitus ([place]) (05/23/2016).  She  has a past surgical history that includes Salpingectomy; Cystectomy; and Esophagogastroduodenoscopy (N/A, 07/13/2014).  Her family history includes Atrial fibrillation in her maternal grandmother; Clotting disorder in her maternal grandmother; Diabetes in her sister and another family member; Hypertension in her father and mother; Other in her paternal grandmother; Stroke in her mother.  She  reports that she has never smoked. She has never used smokeless tobacco. She reports current alcohol use. She reports that she does not use drugs.  Current Outpatient Medications on File Prior to Visit  Medication Sig Dispense Refill   Blood Glucose Monitoring Suppl (ONETOUCH VERIO FLEX SYSTEM) w/Device KIT      clotrimazole-betamethasone (LOTRISONE) cream Apply  externally BID for 2 wks 30 g 0   FARXIGA 10 MG TABS tablet Take 10 mg by mouth daily.     glimepiride (AMARYL) 4 MG tablet Take 4 mg by mouth daily. Take every other day     Lancets (ONETOUCH DELICA PLUS Q000111Q) MISC      metFORMIN (GLUCOPHAGE) 1000 MG tablet Take 1,000 mg by mouth 2 (two) times daily.     ONETOUCH VERIO test strip 1 each 3 (three) times daily.     propranolol ER (INDERAL LA) 60 MG 24 hr capsule Take 60 mg by mouth daily.     Semaglutide, 2 MG/DOSE, 8 MG/3ML SOPN Inject into the skin.     hydrochlorothiazide (HYDRODIURIL) 12.5 MG tablet Take by mouth.     losartan (COZAAR) 100 MG tablet Take by mouth.     No current facility-administered medications on file prior to visit.   She is allergic to latex, amoxicillin, cephalosporins, penicillin g, and penicillins..  Review of Systems Pertinent items are noted in HPI.   Objective:   Blood pressure (!) 144/106, pulse 90, resp. rate 16, height 5' 8.5" (1.74 m), weight 246 lb (111.6 kg), last menstrual period 04/17/2022.  General appearance: alert, cooperative, and no distress See H&P for remainder of exam.     ULTRASOUND:  CLINICAL DATA:  Abnormal uterine bleeding, leiomyomata, menorrhagia, LMP 04/17/2022   EXAM: TRANSABDOMINAL AND TRANSVAGINAL ULTRASOUND OF PELVIS   TECHNIQUE: Both transabdominal and transvaginal ultrasound examinations of the pelvis were performed. Transabdominal technique was performed for global imaging of the pelvis including uterus, ovaries, adnexal regions, and pelvic cul-de-sac. It was necessary to  proceed with endovaginal exam following the transabdominal exam to visualize the lower uterine segment/cervix and ovaries.   COMPARISON:  None Available.   FINDINGS: Uterus   Measurements: 9.2 x 3.8 x 4.6 cm = volume: 84 mL. Anteverted. Heterogeneous myometrium. No myometrial mass.   Endometrium   Thickness: 9 mm. Teardrop shaped mass identified at the lower uterine segment, 2.0 x 1.5  x 2.1 cm, with a stalk extending up the lower uterine segment endometrial canal. This appears to represent a prolapsed endometrial polyp. Vascular stalk is identified on color Doppler imaging.   Right ovary   Measurements: 3.0 x 1.5 x 2.9 cm = volume: 6.6 mL. Normal morphology without mass   Left ovary   Measurements: 2.5 x 2.2 x 2.8 cm = volume: 7.9 mL. Normal morphology without mass   Other findings   No free pelvic fluid or adnexal masses.   IMPRESSION: Probable prolapsed endometrial polyp at the lower uterine segment, 2.1 cm greatest size extending into cervix; consider sonohysterogram for further evaluation, prior to hysteroscopy or endometrial biopsy.     Assessment:   1. Abnormal uterine bleeding due to endometrial polyp      Plan:   Discussion had regarding endometrial endometrial polyp.  In the setting of abnormal uterine bleeding and future desires for conception, it is recommended for polyp removal.  This can be performed by Hysteroscopy D&C with polypectomy. Discussed nature of procedure, including risks and benefits. Advised that the likelihood of resolution of her symptoms was high. All questions answered. Patient notes understanding.  She is ok with recommended procedure and will plan to schedule surgery for 05/22/2022.   Pre-op done today.  Given pre-operative instructions.     A total of 25 minutes were spent during this encounter, including review of previous progress notes, recent imaging and labs, face-to-face with time with patient involving counseling and coordination of care, as well as documentation for current visit.    Rubie Maid, MD [place]

## 2022-05-11 NOTE — Patient Instructions (Signed)
GYNECOLOGY PRE-OPERATIVE INSTRUCTIONS  You are scheduled for surgery on 05/22/2022.  The name of your procedure is: Hysteroscopy D&C with polypectomy.   Please read through these instructions carefully regarding preparation for your surgery: Nothing to eat after midnight on the day prior to surgery.  Do not take any medications unless recommended by your provider on day prior to surgery.  Do not take NSAIDs (Motrin, Aleve) or aspirin 7 days prior to surgery.  You may take Tylenol products for minor aches and pains.  You will receive a prescription for pain medications post-operatively.  You will be contacted by phone approximately 1-2 weeks prior to surgery to schedule your pre-operative appointment.  If you are being admitted to the hospital for an overnight stay, you will  Please call the office if you have any questions regarding your upcoming surgery.    Thank you for choosing [facility] at [facility].

## 2022-05-12 ENCOUNTER — Encounter: Payer: Self-pay | Admitting: Obstetrics and Gynecology

## 2022-05-18 ENCOUNTER — Other Ambulatory Visit: Payer: Self-pay

## 2022-05-18 ENCOUNTER — Encounter
Admission: RE | Admit: 2022-05-18 | Discharge: 2022-05-18 | Disposition: A | Discharge status: Home / Self Care | Payer: BC Managed Care – PPO | Source: Ambulatory Visit | Attending: Obstetrics and Gynecology | Admitting: Obstetrics and Gynecology

## 2022-05-18 VITALS — Ht 68.5 in | Wt 244.0 lb

## 2022-05-18 DIAGNOSIS — E119 Type 2 diabetes mellitus without complications: Secondary | ICD-10-CM

## 2022-05-18 DIAGNOSIS — I10 Essential (primary) hypertension: Secondary | ICD-10-CM

## 2022-05-18 DIAGNOSIS — Z01818 Encounter for other preprocedural examination: Secondary | ICD-10-CM

## 2022-05-18 DIAGNOSIS — E1169 Type 2 diabetes mellitus with other specified complication: Secondary | ICD-10-CM

## 2022-05-18 HISTORY — DX: Other specified postprocedural states: Z98.890

## 2022-05-18 HISTORY — DX: Infectious mononucleosis, unspecified without complication: B27.90

## 2022-05-18 HISTORY — DX: Hyperlipidemia, unspecified: E78.5

## 2022-05-18 NOTE — Patient Instructions (Addendum)
Your procedure is scheduled on: Monday 05/22/22 To find out your arrival time, please call [phone] between 1PM - 3PM on:   Friday 05/19/22 Report to the Registration Desk on the 1st floor of the [facility]. Valet parking is available.  If your arrival time is 6:00 am, do not arrive before that time as the [facility] entrance doors do not open until 6:00 am.  REMEMBER: Instructions that are not followed completely may result in serious medical risk, up to and including death; or upon the discretion of your surgeon and anesthesiologist your surgery may need to be rescheduled.  Do not eat food or drink any liquids after midnight the night before surgery.  No gum chewing or hard candies.  One week prior to surgery: Stop Anti-inflammatories (NSAIDS) such as Advil, Aleve, Ibuprofen, Motrin, Naproxen, Naprosyn and Aspirin based products such as Excedrin, Goody's Powder, BC Powder. You may however, continue to take Tylenol if needed for pain up until the day of surgery.  Stop ANY OVER THE COUNTER supplements until after surgery.  Continue taking all prescribed medications with the exception of the following: metFORMIN (GLUCOPHAGE) 1000 MG tablet (last dose  Friday evening 05/19/22), FARXIGA 10 MG TABS tablet (last dose Thursday 05/18/22), Semaglutide (OZEMPIC), 2 MG/DOSE, 8 MG/3ML SOPN (last dose Sunday 05/14/22)  TAKE ONLY THESE MEDICATIONS THE MORNING OF SURGERY WITH A SIP OF WATER:  propranolol ER (INDERAL LA) 60 MG 24 hr capsule   No Alcohol for 24 hours before or after surgery.  No Smoking including e-cigarettes for 24 hours before surgery.  No chewable tobacco products for at least 6 hours before surgery.  No nicotine patches on the day of surgery.  Do not use any "recreational" drugs for at least a week (preferably 2 weeks) before your surgery.  Please be advised that the combination of cocaine and anesthesia may have negative outcomes, up to and including death. If you test  positive for cocaine, your surgery will be cancelled.  On the morning of surgery brush your teeth with toothpaste and water, you may rinse your mouth with mouthwash if you wish. Do not swallow any toothpaste or mouthwash.  Use CHG Soap or wipes as directed on instruction sheet. Shower with your regular soap.  Do not wear lotions, powders, or perfumes.   Do not shave body hair from the neck down 48 hours before surgery.  Wear comfortable clothing (specific to your surgery type) to the hospital.  Do not wear jewelry, make-up, hairpins, clips or nail polish.  Contact lenses, hearing aids and dentures may not be worn into surgery.  Do not bring valuables to the hospital. [facility] is not responsible for any missing/lost belongings or valuables.   Notify your doctor if there is any change in your medical condition (cold, fever, infection).  If you are being discharged the day of surgery, you will not be allowed to drive home. You will need a responsible individual to drive you home and stay with you for 24 hours after surgery.   If you are taking public transportation, you will need to have a responsible individual with you.  If you are being admitted to the hospital overnight, leave your suitcase in the car. After surgery it may be brought to your room.  In case of increased patient census, it may be necessary for you, the patient, to continue your postoperative care in the Same Day Surgery department.  After surgery, you can help prevent lung complications by doing breathing exercises.  Take deep breaths and cough every 1-2 hours. Your doctor may order a device called an Incentive Spirometer to help you take deep breaths. When coughing or sneezing, hold a pillow firmly against your incision with both hands. This is called "splinting." Doing this helps protect your incision. It also decreases belly discomfort.  Surgery Visitation Policy:  Patients undergoing a surgery or procedure  may have two family members or support persons with them as long as the person is not COVID-19 positive or experiencing its symptoms.   Inpatient Visitation:    Visiting hours are 7 a.m. to 8 p.m. Up to four visitors are allowed at one time in a patient room. The visitors may rotate out with other people during the day. One designated support person (adult) may remain overnight.    Please call the [facility] Dept. at [phone] if you have any questions about these instructions.

## 2022-05-19 ENCOUNTER — Encounter
Admission: RE | Admit: 2022-05-19 | Discharge: 2022-05-19 | Disposition: A | Discharge status: Home / Self Care | Payer: BC Managed Care – PPO | Source: Ambulatory Visit | Attending: Obstetrics and Gynecology | Admitting: Obstetrics and Gynecology

## 2022-05-19 ENCOUNTER — Encounter: Payer: Self-pay | Admitting: Urgent Care

## 2022-05-19 DIAGNOSIS — E1165 Type 2 diabetes mellitus with hyperglycemia: Secondary | ICD-10-CM | POA: Diagnosis not present

## 2022-05-19 DIAGNOSIS — E785 Hyperlipidemia, unspecified: Secondary | ICD-10-CM | POA: Insufficient documentation

## 2022-05-19 DIAGNOSIS — E1169 Type 2 diabetes mellitus with other specified complication: Secondary | ICD-10-CM

## 2022-05-19 DIAGNOSIS — Z7984 Long term (current) use of oral hypoglycemic drugs: Secondary | ICD-10-CM | POA: Insufficient documentation

## 2022-05-19 DIAGNOSIS — E119 Type 2 diabetes mellitus without complications: Secondary | ICD-10-CM

## 2022-05-19 DIAGNOSIS — Z01818 Encounter for other preprocedural examination: Secondary | ICD-10-CM | POA: Insufficient documentation

## 2022-05-19 DIAGNOSIS — E118 Type 2 diabetes mellitus with unspecified complications: Secondary | ICD-10-CM | POA: Insufficient documentation

## 2022-05-19 DIAGNOSIS — N840 Polyp of corpus uteri: Secondary | ICD-10-CM | POA: Diagnosis not present

## 2022-05-19 DIAGNOSIS — Z794 Long term (current) use of insulin: Secondary | ICD-10-CM

## 2022-05-19 DIAGNOSIS — N939 Abnormal uterine and vaginal bleeding, unspecified: Secondary | ICD-10-CM

## 2022-05-19 DIAGNOSIS — I10 Essential (primary) hypertension: Secondary | ICD-10-CM | POA: Insufficient documentation

## 2022-05-19 DIAGNOSIS — N938 Other specified abnormal uterine and vaginal bleeding: Secondary | ICD-10-CM | POA: Diagnosis present

## 2022-05-19 DIAGNOSIS — D250 Submucous leiomyoma of uterus: Secondary | ICD-10-CM | POA: Diagnosis not present

## 2022-05-19 DIAGNOSIS — F419 Anxiety disorder, unspecified: Secondary | ICD-10-CM | POA: Diagnosis not present

## 2022-05-19 DIAGNOSIS — Z79899 Other long term (current) drug therapy: Secondary | ICD-10-CM | POA: Diagnosis not present

## 2022-05-19 DIAGNOSIS — Z6836 Body mass index (BMI) 36.0-36.9, adult: Secondary | ICD-10-CM | POA: Diagnosis not present

## 2022-05-19 DIAGNOSIS — E669 Obesity, unspecified: Secondary | ICD-10-CM | POA: Diagnosis not present

## 2022-05-19 DIAGNOSIS — Z7985 Long-term (current) use of injectable non-insulin antidiabetic drugs: Secondary | ICD-10-CM | POA: Insufficient documentation

## 2022-05-19 LAB — CBC
HCT: 40.4 % (ref 36.0–46.0)
Hemoglobin: 13.3 g/dL (ref 12.0–15.0)
MCH: 28.3 pg (ref 26.0–34.0)
MCHC: 32.9 g/dL (ref 30.0–36.0)
MCV: 86 fL (ref 80.0–100.0)
Platelets: 267 10*3/uL (ref 150–400)
RBC: 4.7 MIL/uL (ref 3.87–5.11)
RDW: 12.7 % (ref 11.5–15.5)
WBC: 7.8 10*3/uL (ref 4.0–10.5)
nRBC: 0 % (ref 0.0–0.2)

## 2022-05-20 LAB — HEMOGLOBIN A1C
Hgb A1c MFr Bld: 12.1 % — ABNORMAL HIGH (ref 4.8–5.6)
Mean Plasma Glucose: 301 mg/dL

## 2022-05-20 NOTE — Progress Notes (Signed)
  Perioperative Services: Pre-Admission/Anesthesia Testing  Abnormal Lab Notification    Date: 05/19/2022  Name: Annette Leon MRN:   [id number]  Re: Abnormal labs noted during PAT appointment   Provider(s) Notified: Rubie Maid, MD -- (OB/GYN Surgeon)    Lorelei Pont, Deirdre Evener, PA-C -- (PCP)  Notification mode: Routed and/or faxed via [facility] LAB VALUE(S): Lab Results  Component Value Date   GLUCOSE 299 (H) 05/22/2016    Notes: Patient is scheduled for an elective HYSTEROSCOPY D&C WITH POLYPECTOMY on 05/22/2022 with Dr. Rubie Maid, MD.   Patient with a T2DM diagnosis. She is currently on both oral (metformin + dapagliflozin + glimepiride) and parenteral (semaglutide) therapies. Records reviewed prior to upcoming surgery. Last Hgb A1c was in 08/2021, at which time it was elevated at 10.8%. Repeat Hgb A1c added to preoperative labs and found to be further elevated at 12.1% .   Please note, preoperative glucose levels stand to be elevated beyond today's value, as patient will be off of her antidiabetic agents as follows:  Last dose of her GLP1 (semaglutide) on 05/14/2022  Last dose of SGLT2i (dapagliflozin) on 05/18/2022 Last dose of biguanide (metformin) on 05/19/2022.   In efforts to reduce the risk of developing SSI, or other potential perioperative complications, this communication is being sent in order to determine if patient is deemed to have adequate medical optimization, including preoperative glycemic control. With that being said, the benefit of improving glycemic control must be weighed against the overall risk associated with delaying a necessary elective surgery for this patient.  Forwarding result to surgeon and PCP for review and appropriate follow up.   Honor Loh, MSN, APRN, FNP-C, CEN [facility]  Peri-operative Services Nurse Practitioner Phone: [phone]

## 2022-05-22 ENCOUNTER — Other Ambulatory Visit: Payer: Self-pay

## 2022-05-22 ENCOUNTER — Encounter
Admission: RE | Disposition: A | Discharge status: Home / Self Care | Payer: Self-pay | Source: Ambulatory Visit | Attending: Obstetrics and Gynecology

## 2022-05-22 ENCOUNTER — Ambulatory Visit: Payer: BC Managed Care – PPO | Admitting: Registered Nurse

## 2022-05-22 ENCOUNTER — Encounter: Payer: Self-pay | Admitting: Obstetrics and Gynecology

## 2022-05-22 ENCOUNTER — Ambulatory Visit
Admission: RE | Admit: 2022-05-22 | Discharge: 2022-05-22 | Disposition: A | Discharge status: Home / Self Care | Payer: BC Managed Care – PPO | Source: Ambulatory Visit | Attending: Obstetrics and Gynecology | Admitting: Obstetrics and Gynecology

## 2022-05-22 DIAGNOSIS — E1165 Type 2 diabetes mellitus with hyperglycemia: Secondary | ICD-10-CM | POA: Insufficient documentation

## 2022-05-22 DIAGNOSIS — Z6836 Body mass index (BMI) 36.0-36.9, adult: Secondary | ICD-10-CM | POA: Insufficient documentation

## 2022-05-22 DIAGNOSIS — N939 Abnormal uterine and vaginal bleeding, unspecified: Secondary | ICD-10-CM

## 2022-05-22 DIAGNOSIS — N895 Stricture and atresia of vagina: Secondary | ICD-10-CM

## 2022-05-22 DIAGNOSIS — Z01818 Encounter for other preprocedural examination: Secondary | ICD-10-CM

## 2022-05-22 DIAGNOSIS — N840 Polyp of corpus uteri: Secondary | ICD-10-CM | POA: Insufficient documentation

## 2022-05-22 DIAGNOSIS — E669 Obesity, unspecified: Secondary | ICD-10-CM | POA: Insufficient documentation

## 2022-05-22 DIAGNOSIS — D259 Leiomyoma of uterus, unspecified: Secondary | ICD-10-CM

## 2022-05-22 DIAGNOSIS — E785 Hyperlipidemia, unspecified: Secondary | ICD-10-CM | POA: Insufficient documentation

## 2022-05-22 DIAGNOSIS — E119 Type 2 diabetes mellitus without complications: Secondary | ICD-10-CM

## 2022-05-22 DIAGNOSIS — I10 Essential (primary) hypertension: Secondary | ICD-10-CM | POA: Insufficient documentation

## 2022-05-22 DIAGNOSIS — D250 Submucous leiomyoma of uterus: Secondary | ICD-10-CM | POA: Insufficient documentation

## 2022-05-22 DIAGNOSIS — N858 Other specified noninflammatory disorders of uterus: Secondary | ICD-10-CM

## 2022-05-22 DIAGNOSIS — Z79899 Other long term (current) drug therapy: Secondary | ICD-10-CM | POA: Insufficient documentation

## 2022-05-22 DIAGNOSIS — Z7984 Long term (current) use of oral hypoglycemic drugs: Secondary | ICD-10-CM | POA: Insufficient documentation

## 2022-05-22 DIAGNOSIS — N938 Other specified abnormal uterine and vaginal bleeding: Secondary | ICD-10-CM | POA: Insufficient documentation

## 2022-05-22 DIAGNOSIS — Z7985 Long-term (current) use of injectable non-insulin antidiabetic drugs: Secondary | ICD-10-CM | POA: Insufficient documentation

## 2022-05-22 DIAGNOSIS — F419 Anxiety disorder, unspecified: Secondary | ICD-10-CM | POA: Insufficient documentation

## 2022-05-22 HISTORY — PX: HYSTEROSCOPY WITH D & C: SHX1775

## 2022-05-22 HISTORY — PX: MYOMECTOMY: SHX85

## 2022-05-22 LAB — GLUCOSE, CAPILLARY
Glucose-Capillary: 324 mg/dL — ABNORMAL HIGH (ref 70–99)
Glucose-Capillary: 306 mg/dL — ABNORMAL HIGH (ref 70–99)
Glucose-Capillary: 295 mg/dL — ABNORMAL HIGH (ref 70–99)

## 2022-05-22 LAB — POCT PREGNANCY, URINE: Preg Test, Ur: NEGATIVE

## 2022-05-22 SURGERY — DILATATION AND CURETTAGE /HYSTEROSCOPY
Anesthesia: General | Site: Vagina

## 2022-05-22 MED ORDER — OXYCODONE HCL 5 MG PO TABS
ORAL_TABLET | ORAL | Status: AC
  Filled 2022-05-22: qty 1

## 2022-05-22 MED ORDER — GABAPENTIN 300 MG PO CAPS
ORAL_CAPSULE | ORAL | Status: AC
  Administered 2022-05-22: 300 mg via ORAL
  Filled 2022-05-22: qty 1

## 2022-05-22 MED ORDER — MIDAZOLAM HCL 2 MG/2ML IJ SOLN
INTRAMUSCULAR | Status: DC | PRN
  Administered 2022-05-22: 2 mg via INTRAVENOUS

## 2022-05-22 MED ORDER — GLYCOPYRROLATE 0.2 MG/ML IJ SOLN
INTRAMUSCULAR | Status: AC
  Filled 2022-05-22: qty 1

## 2022-05-22 MED ORDER — PROPOFOL 1000 MG/100ML IV EMUL
INTRAVENOUS | Status: AC
  Filled 2022-05-22: qty 100

## 2022-05-22 MED ORDER — ACETAMINOPHEN-CODEINE 300-30 MG PO TABS: 1.0000 | ORAL_TABLET | Freq: Four times a day (QID) | ORAL | 0 refills | Status: DC | PRN

## 2022-05-22 MED ORDER — SCOPOLAMINE 1 MG/3DAYS TD PT72
1.0000 | MEDICATED_PATCH | TRANSDERMAL | Status: DC
  Administered 2022-05-22: 1.5 mg via TRANSDERMAL

## 2022-05-22 MED ORDER — GABAPENTIN 300 MG PO CAPS: 300.0000 mg | ORAL_CAPSULE | ORAL | Status: AC

## 2022-05-22 MED ORDER — LIDOCAINE HCL (PF) 2 % IJ SOLN
INTRAMUSCULAR | Status: AC
  Filled 2022-05-22: qty 5

## 2022-05-22 MED ORDER — FENTANYL CITRATE (PF) 100 MCG/2ML IJ SOLN
INTRAMUSCULAR | Status: AC
  Filled 2022-05-22: qty 2

## 2022-05-22 MED ORDER — INSULIN ASPART 100 UNIT/ML IJ SOLN
5.0000 [IU] | Freq: Once | INTRAMUSCULAR | Status: AC
  Administered 2022-05-22: 5 [IU] via SUBCUTANEOUS

## 2022-05-22 MED ORDER — FENTANYL CITRATE (PF) 100 MCG/2ML IJ SOLN: 25.0000 ug | INTRAMUSCULAR | Status: DC | PRN

## 2022-05-22 MED ORDER — SODIUM CHLORIDE 0.9 % IR SOLN
Status: DC | PRN
  Administered 2022-05-22: 4500 mL

## 2022-05-22 MED ORDER — 0.9 % SODIUM CHLORIDE (POUR BTL) OPTIME
TOPICAL | Status: DC | PRN
  Administered 2022-05-22: 500 mL

## 2022-05-22 MED ORDER — OXYCODONE HCL 5 MG PO TABS
5.0000 mg | ORAL_TABLET | Freq: Once | ORAL | Status: AC | PRN
  Administered 2022-05-22: 5 mg via ORAL

## 2022-05-22 MED ORDER — ACETAMINOPHEN 500 MG PO TABS
ORAL_TABLET | ORAL | Status: AC
  Administered 2022-05-22: 1000 mg via ORAL
  Filled 2022-05-22: qty 2

## 2022-05-22 MED ORDER — FENTANYL CITRATE (PF) 100 MCG/2ML IJ SOLN
INTRAMUSCULAR | Status: AC
  Administered 2022-05-22: 50 ug via INTRAVENOUS
  Filled 2022-05-22: qty 2

## 2022-05-22 MED ORDER — SUGAMMADEX SODIUM 200 MG/2ML IV SOLN
INTRAVENOUS | Status: DC | PRN
  Administered 2022-05-22: 221.4 mg via INTRAVENOUS

## 2022-05-22 MED ORDER — DEXMEDETOMIDINE HCL IN NACL 200 MCG/50ML IV SOLN
INTRAVENOUS | Status: DC | PRN
  Administered 2022-05-22: 16 ug via INTRAVENOUS

## 2022-05-22 MED ORDER — DEXAMETHASONE SODIUM PHOSPHATE 10 MG/ML IJ SOLN
INTRAMUSCULAR | Status: AC
  Filled 2022-05-22: qty 1

## 2022-05-22 MED ORDER — INSULIN ASPART 100 UNIT/ML IJ SOLN
INTRAMUSCULAR | Status: AC
  Administered 2022-05-22: 5 [IU] via SUBCUTANEOUS
  Filled 2022-05-22: qty 1

## 2022-05-22 MED ORDER — LIDOCAINE HCL (CARDIAC) PF 100 MG/5ML IV SOSY
PREFILLED_SYRINGE | INTRAVENOUS | Status: DC | PRN
  Administered 2022-05-22: 100 mg via INTRAVENOUS

## 2022-05-22 MED ORDER — MIDAZOLAM HCL 2 MG/2ML IJ SOLN
INTRAMUSCULAR | Status: AC
  Filled 2022-05-22: qty 2

## 2022-05-22 MED ORDER — CHLORHEXIDINE GLUCONATE 0.12 % MT SOLN
OROMUCOSAL | Status: AC
  Administered 2022-05-22: 15 mL via OROMUCOSAL
  Filled 2022-05-22: qty 15

## 2022-05-22 MED ORDER — ROCURONIUM BROMIDE 100 MG/10ML IV SOLN
INTRAVENOUS | Status: DC | PRN
  Administered 2022-05-22: 30 mg via INTRAVENOUS
  Administered 2022-05-22: 20 mg via INTRAVENOUS

## 2022-05-22 MED ORDER — CELECOXIB 200 MG PO CAPS: 400.0000 mg | ORAL_CAPSULE | ORAL | Status: AC

## 2022-05-22 MED ORDER — ACETAMINOPHEN 10 MG/ML IV SOLN: 1000.0000 mg | Freq: Once | INTRAVENOUS | Status: DC | PRN

## 2022-05-22 MED ORDER — ONDANSETRON HCL 4 MG/2ML IJ SOLN: 4.0000 mg | Freq: Once | INTRAMUSCULAR | Status: DC | PRN

## 2022-05-22 MED ORDER — INSULIN ASPART 100 UNIT/ML IJ SOLN
INTRAMUSCULAR | Status: AC
  Filled 2022-05-22: qty 1

## 2022-05-22 MED ORDER — FAMOTIDINE 20 MG PO TABS: 20.0000 mg | ORAL_TABLET | Freq: Once | ORAL | Status: AC

## 2022-05-22 MED ORDER — FAMOTIDINE 20 MG PO TABS
ORAL_TABLET | ORAL | Status: AC
  Administered 2022-05-22: 20 mg via ORAL
  Filled 2022-05-22: qty 1

## 2022-05-22 MED ORDER — IBUPROFEN 800 MG PO TABS: 800.0000 mg | ORAL_TABLET | Freq: Three times a day (TID) | ORAL | 1 refills | Status: DC | PRN

## 2022-05-22 MED ORDER — ONDANSETRON HCL 4 MG/2ML IJ SOLN
INTRAMUSCULAR | Status: AC
  Filled 2022-05-22: qty 2

## 2022-05-22 MED ORDER — SUCCINYLCHOLINE CHLORIDE 200 MG/10ML IV SOSY
PREFILLED_SYRINGE | INTRAVENOUS | Status: DC | PRN
  Administered 2022-05-22: 100 mg via INTRAVENOUS

## 2022-05-22 MED ORDER — LACTATED RINGERS IV SOLN: INTRAVENOUS | Status: DC

## 2022-05-22 MED ORDER — CELECOXIB 200 MG PO CAPS
ORAL_CAPSULE | ORAL | Status: AC
  Administered 2022-05-22: 400 mg via ORAL
  Filled 2022-05-22: qty 2

## 2022-05-22 MED ORDER — ACETAMINOPHEN 500 MG PO TABS: 1000.0000 mg | ORAL_TABLET | ORAL | Status: AC

## 2022-05-22 MED ORDER — ORAL CARE MOUTH RINSE: 15.0000 mL | Freq: Once | OROMUCOSAL | Status: AC

## 2022-05-22 MED ORDER — CHLORHEXIDINE GLUCONATE 0.12 % MT SOLN: 15.0000 mL | Freq: Once | OROMUCOSAL | Status: AC

## 2022-05-22 MED ORDER — PROPOFOL 500 MG/50ML IV EMUL
INTRAVENOUS | Status: DC | PRN
  Administered 2022-05-22: 150 ug/kg/min via INTRAVENOUS

## 2022-05-22 MED ORDER — INSULIN ASPART 100 UNIT/ML IJ SOLN: 5.0000 [IU] | Freq: Once | INTRAMUSCULAR | Status: AC

## 2022-05-22 MED ORDER — BUPIVACAINE HCL (PF) 0.5 % IJ SOLN
INTRAMUSCULAR | Status: AC
  Filled 2022-05-22: qty 30

## 2022-05-22 MED ORDER — SCOPOLAMINE 1 MG/3DAYS TD PT72: 1.0000 | MEDICATED_PATCH | TRANSDERMAL | Status: DC

## 2022-05-22 MED ORDER — PROPOFOL 10 MG/ML IV BOLUS
INTRAVENOUS | Status: DC | PRN
  Administered 2022-05-22: 200 mg via INTRAVENOUS
  Administered 2022-05-22: 50 mg via INTRAVENOUS
  Administered 2022-05-22: 90 mg via INTRAVENOUS

## 2022-05-22 MED ORDER — OXYCODONE HCL 5 MG/5ML PO SOLN: 5.0000 mg | Freq: Once | ORAL | Status: AC | PRN

## 2022-05-22 MED ORDER — PROPOFOL 10 MG/ML IV BOLUS
INTRAVENOUS | Status: AC
  Filled 2022-05-22: qty 20

## 2022-05-22 MED ORDER — SODIUM CHLORIDE 0.9 % IV SOLN: INTRAVENOUS | Status: DC

## 2022-05-22 MED ORDER — GLYCOPYRROLATE 0.2 MG/ML IJ SOLN
INTRAMUSCULAR | Status: DC | PRN
  Administered 2022-05-22: .2 mg via INTRAVENOUS

## 2022-05-22 MED ORDER — SCOPOLAMINE 1 MG/3DAYS TD PT72
MEDICATED_PATCH | TRANSDERMAL | Status: AC
  Filled 2022-05-22: qty 1

## 2022-05-22 MED ORDER — FENTANYL CITRATE (PF) 100 MCG/2ML IJ SOLN
INTRAMUSCULAR | Status: DC | PRN
  Administered 2022-05-22 (×3): 50 ug via INTRAVENOUS

## 2022-05-22 SURGICAL SUPPLY — 20 items
ACCESSORY FLUID MGT SYMPHION (MISCELLANEOUS) IMPLANT
DEVICE FLUID MGT SYMPHION (MISCELLANEOUS) IMPLANT
DRSG TELFA 3X8 NADH STRL (GAUZE/BANDAGES/DRESSINGS) ×1 IMPLANT
GLOVE BIO SURGEON STRL SZ 6.5 (GLOVE) ×1 IMPLANT
GLOVE INDICATOR 7.0 STRL GRN (GLOVE) ×1 IMPLANT
GOWN STRL REUS W/ TWL LRG LVL3 (GOWN DISPOSABLE) ×2 IMPLANT
GOWN STRL REUS W/TWL LRG LVL3 (GOWN DISPOSABLE) ×2
HANDLE YANKAUER SUCT OPEN TIP (MISCELLANEOUS) IMPLANT
IV NS IRRIG 3000ML ARTHROMATIC (IV SOLUTION) IMPLANT
KIT TURNOVER CYSTO (KITS) IMPLANT
MANIFOLD NEPTUNE II (INSTRUMENTS) ×1 IMPLANT
NDL HYPO 22X1.5 SAFETY MO (MISCELLANEOUS) IMPLANT
NEEDLE HYPO 22X1.5 SAFETY MO (MISCELLANEOUS) ×1 IMPLANT
NS IRRIG 500ML POUR BTL (IV SOLUTION) IMPLANT
PACK DNC HYST (MISCELLANEOUS) ×1 IMPLANT
PAD PREP 24X41 OB/GYN DISP (PERSONAL CARE ITEMS) ×1 IMPLANT
SCRUB CHG 4% DYNA-HEX 4OZ (MISCELLANEOUS) ×1 IMPLANT
SOL PREP PVP 2OZ (MISCELLANEOUS) ×1
SOLUTION PREP PVP 2OZ (MISCELLANEOUS) ×1 IMPLANT
SYR CONTROL 10ML LL (SYRINGE) IMPLANT

## 2022-05-22 NOTE — H&P (Signed)
GYNECOLOGY PROGRESS NOTE  Subjective:    Patient ID: Annette Leon, female    DOB: 1991/02/02, 32 y.o.   MRN: [id number]  HPI  Patient is a 32 y.o. G0P0000 female who presents for management of Hysteroscopy and D&C for abnormal uterine bleeding and uterine mass (polyp vs fibroid). She was referred by Ardeth Perfect, PA of this practice. She was last seen on 0000000 by Elmo Putt for abnormal uterine bleeding (very heavy menstrual cycle lasting ~ 10 days, had to use ultra pds changing every hour for Days 2-4 of the cycle). She had a pelvic ultrasound on 04/17/22 which noted an endometrial mass with stalk, .suspected prolapsed polyp. Patient notes that she recently got married 3 months ago, is having painful intercourse as she thinks it may sometimes prolapse through her cervix and her partner may be hitting it.  Also notes that they are thinking about starting a family soon, and worry that this may interfere with a pregnancy.   The following portions of the patient's history were reviewed and updated as appropriate:  She  has a past medical history of Anxiety, HLD (hyperlipidemia), Hypertension, Mononucleosis, PONV (postoperative nausea and vomiting), Pre-diabetes, Splenic infarct (08/2021), and Type 2 diabetes mellitus ([place]) (05/23/2016).  She  has a past surgical history that includes Salpingectomy; Cystectomy; and Esophagogastroduodenoscopy (N/A, 07/13/2014).  Her family history includes Atrial fibrillation in her maternal grandmother; Clotting disorder in her maternal grandmother; Diabetes in her sister and another family member; Hypertension in her father and mother; Other in her paternal grandmother; Stroke in her mother.  She  reports that she has never smoked. She has never used smokeless tobacco. She reports current alcohol use. She reports that she does not use drugs.  No current facility-administered medications on file prior to encounter.   Current Outpatient Medications on File  Prior to Encounter  Medication Sig Dispense Refill   propranolol ER (INDERAL LA) 60 MG 24 hr capsule Take 60 mg by mouth daily.     Blood Glucose Monitoring Suppl (ONETOUCH VERIO FLEX SYSTEM) w/Device KIT      FARXIGA 10 MG TABS tablet Take 10 mg by mouth daily.     glimepiride (AMARYL) 4 MG tablet Take 4 mg by mouth daily. Take every other day     hydrochlorothiazide (HYDRODIURIL) 12.5 MG tablet Take 12.5 mg by mouth daily.     Lancets (ONETOUCH DELICA PLUS Q000111Q) MISC      losartan (COZAAR) 100 MG tablet Take 100 mg by mouth daily.     metFORMIN (GLUCOPHAGE) 1000 MG tablet Take 1,000 mg by mouth 2 (two) times daily.     ONETOUCH VERIO test strip 1 each 3 (three) times daily.     Semaglutide, 2 MG/DOSE, 8 MG/3ML SOPN Inject 2 mg into the skin once a week.     She is allergic to latex, amoxicillin, cephalosporins, penicillin g, and penicillins..  Review of Systems Pertinent items are noted in HPI.   Objective:   Blood pressure 133/88, pulse 87, temperature (!) 97.3 F (36.3 C), resp. rate 18, height 5' 8.5" (1.74 m), weight 110.7 kg, last menstrual period 05/10/2022, SpO2 99 %.  CONSTITUTIONAL: Well-developed, well-nourished female in no acute distress.  HENT:  Normocephalic, atraumatic, External right and left ear normal. Oropharynx is clear and moist EYES: Conjunctivae and EOM are normal. Pupils are equal, round, and reactive to light. No scleral icterus.  NECK: Normal range of motion, supple, no masses SKIN: Skin is warm and dry. No rash noted.  Not diaphoretic. No erythema. No pallor. NEUROLOGIC: Alert and oriented to person, place, and time. Normal reflexes, muscle tone coordination. No cranial nerve deficit noted. PSYCHIATRIC: Normal mood and affect. Normal behavior. Normal judgment and thought content. CARDIOVASCULAR: Normal heart rate noted, regular rhythm RESPIRATORY: Effort and breath sounds normal, no problems with respiration noted ABDOMEN: Soft, nontender,  nondistended. PELVIC: Deferred MUSCULOSKELETAL: Normal range of motion. No edema and no tenderness. 2+ distal pulses.   Labs:  Results for orders placed or performed during the hospital encounter of 05/22/22  Glucose, capillary  Result Value Ref Range   Glucose-Capillary 324 (H) 70 - 99 mg/dL   Comment 1 Notify RN    Comment 2 Document in Chart   Pregnancy, urine POC  Result Value Ref Range   Preg Test, Ur NEGATIVE NEGATIVE   Lab Results  Component Value Date   WBC 7.8 05/19/2022   HGB 13.3 05/19/2022   HCT 40.4 05/19/2022   MCV 86.0 05/19/2022   PLT 267 05/19/2022    Lab Results  Component Value Date   HGBA1C 12.1 (H) 05/19/2022     ULTRASOUND:  CLINICAL DATA:  Abnormal uterine bleeding, leiomyomata, menorrhagia, LMP 04/17/2022   EXAM: TRANSABDOMINAL AND TRANSVAGINAL ULTRASOUND OF PELVIS   TECHNIQUE: Both transabdominal and transvaginal ultrasound examinations of the pelvis were performed. Transabdominal technique was performed for global imaging of the pelvis including uterus, ovaries, adnexal regions, and pelvic cul-de-sac. It was necessary to proceed with endovaginal exam following the transabdominal exam to visualize the lower uterine segment/cervix and ovaries.   COMPARISON:  None Available.   FINDINGS: Uterus   Measurements: 9.2 x 3.8 x 4.6 cm = volume: 84 mL. Anteverted. Heterogeneous myometrium. No myometrial mass.   Endometrium   Thickness: 9 mm. Teardrop shaped mass identified at the lower uterine segment, 2.0 x 1.5 x 2.1 cm, with a stalk extending up the lower uterine segment endometrial canal. This appears to represent a prolapsed endometrial polyp. Vascular stalk is identified on color Doppler imaging.   Right ovary   Measurements: 3.0 x 1.5 x 2.9 cm = volume: 6.6 mL. Normal morphology without mass   Left ovary   Measurements: 2.5 x 2.2 x 2.8 cm = volume: 7.9 mL. Normal morphology without mass   Other findings   No free pelvic  fluid or adnexal masses.   IMPRESSION: Probable prolapsed endometrial polyp at the lower uterine segment, 2.1 cm greatest size extending into cervix; consider sonohysterogram for further evaluation, prior to hysteroscopy or endometrial biopsy.     Assessment:   Type II DM (uncontrolled), without long-term use of insulin.  Currently uncontrolled. Dysfunctional uterine bleeding  Endometrial mass (polyp vs fibroid)  Plan:   - Discussion had regarding endometrial mass (polyp vs fibroid).  In the setting of abnormal uterine bleeding and future desires for conception, it is recommended for removal.  This can be performed by Hysteroscopy D&C with polypectomy/myomectomy. Discussed nature of procedure, including risks and benefits. Advised that the likelihood of resolution of her symptoms was high. All questions answered. Patient notes understanding.  She has given consent for the procedure.  - Can utilize TXA in the future for heavy periods if still present, does not desire hormonal manipulation with contraception as she and her partner would like to conceive in the near future.  - Type II DM uncontrolled currently, despite multiple agents.  Patient has not seen her Endocrinologist since last year, plans to f/u after this surgery. Stressed importance of good glucose control prior to conception  to decrease risk of miscarriages and fetal anomalies.      Rubie Maid, MD [place]

## 2022-05-22 NOTE — Anesthesia Postprocedure Evaluation (Signed)
Anesthesia Post Note  Patient: Annette Leon  Procedure(s) Performed: HYSTEROSCOPY D&C (Vagina ) VAGINAL MYOMECTOMY (Vagina )  Patient location during evaluation: PACU Anesthesia Type: General Level of consciousness: awake and alert, oriented and patient cooperative Pain management: pain level controlled Vital Signs Assessment: post-procedure vital signs reviewed and stable Respiratory status: spontaneous breathing, nonlabored ventilation and respiratory function stable Cardiovascular status: blood pressure returned to baseline and stable Postop Assessment: adequate PO intake Anesthetic complications: no   No notable events documented.   Last Vitals:  Vitals:   05/22/22 1415 05/22/22 1425  BP: 118/79   Pulse: 85 90  Resp: 20 18  Temp:    SpO2: 97% 99%    Last Pain:  Vitals:   05/22/22 1425  PainSc: 3                  Darrin Nipper

## 2022-05-22 NOTE — Transfer of Care (Signed)
Immediate Anesthesia Transfer of Care Note  Patient: Annette Leon  Procedure(s) Performed: Procedure(s): HYSTEROSCOPY D&C (N/A) VAGINAL MYOMECTOMY (N/A)  Patient Location: PACU  Anesthesia Type:General  Level of Consciousness: sedated  Airway & Oxygen Therapy: Patient Spontanous Breathing and Patient connected to face mask oxygen  Post-op Assessment: Report given to RN and Post -op Vital signs reviewed and stable  Post vital signs: Reviewed and stable  Last Vitals:  Vitals:   05/22/22 0917 05/22/22 1346  BP: 133/88 113/72  Pulse: 87 95  Resp:  18  Temp:  36.9 C  SpO2:  [id number]    Complications: No apparent anesthesia complications

## 2022-05-22 NOTE — Progress Notes (Signed)
Patient plans to f/u with Endocrinologist after her surgery.

## 2022-05-22 NOTE — Op Note (Addendum)
Procedure(s): HYSTEROSCOPY D&C VAGINAL MYOMECTOMY Procedure Note  Annette Leon female 32 y.o. 05/22/2022  Indications: The patient is a 33 y.o. G0P0000 female with dysfunctional uterine bleeding, endometrial mass (polyp vs fibroid).   Pre-operative Diagnosis: Dysfunctional uterine bleeding, endometrial mass (polyp vs fibroid)  Post-operative Diagnosis: Dysfunctional uterine bleeding, narrow vaginal introitus, aborting myoma  Surgeon: Rubie Maid, MD  Assistants:  Surgical scrub tech.   Anesthesia: General endotracheal anesthesia  Findings:  Narrow vaginal introitus Flesh-colored mass noted in vagina, protruding from the cervix, slightly friable but smooth, partially firm (suspected fibroid).  Cervix dilated at ~ 2-3 cm due to protruding mass  Procedure Details: The patient was seen in the Holding Room. The risks, benefits, complications, treatment options, and expected outcomes were discussed with the patient.  The patient concurred with the proposed plan, giving informed consent.  The site of surgery properly noted/marked. The patient was taken to the Operating Room, identified as Annette Leon and the procedure verified as Procedure(s) (LRB): HYSTEROSCOPY D&C (N/A), MYOMECTOMY VS POLYPECTOMY (N/A).   She was then placed under general anesthesia without difficulty. She was placed in the dorsal lithotomy position, and was prepped and draped in a sterile manner.  A Time Out was held and the above information confirmed. A straight catheterization was performed. A sterile speculum was inserted into the vagina (narrow introitus noted so narrow Graves speculum was used).  The view of the cervix was obstructed by a protruding flesh colored smooth mass in the vagina, ~ 2.5 cm wide. Mass suspected to be an aborting myoma.  Attempts were made to grasp the mass using a ring forceps and twist at stalk for removal however this was unsuccessful due to it's size and likely thickness of the  stalk.  The mass was then grasped with a tenaculum, and in a piecemeal fashion was cored up to the level of the external cervical os.   using 3-0 Vicryl in a running fashion. A 5 mm hysteroscope was then inserted into the cervix and advanced to the fundus of the uterine cavity.  Upon survey of the cavity, there was still a moderate portion of the mass filling the uterine cavity, protruding down to the level of the internal cervical os.  The Symphion device was then inserted and activated, with several attempts to complete the myomectomy hysteroscopically, however this proved difficult due to inability to maintain insufflation of the uterine cavity with the saline as an affect of the patulous cervix. Several instruments were attempted to help to reduce fluid loss from the cervix, however due to the narrow introitus it was difficult to fit multiple instruments into the vaginal canal at one time. The Symphion device was then removed from the uterine cavity.  A polyps forcep was then inserted into the uterine cavity in an attempt to grasp the remainder of the stalk of the myoma blindly, however this was also unsuccessful.  The decision was made to conclude the case at this time, and will have patient return for completion of the procedure in 4-6 weeks when cervix was no longer excessively dilated. The speculum and tenaculum were removed from the cervix.  Hemostasis was noted.    The patient tolerated the procedure well.  All instruments, needles, and sponge counts were correct x 2. The patient was taken to the recovery room awake, extubated and in stable condition.    Estimated Blood Loss:  20 ml      Drains: straight catheterization prior to procedure with  10 ml  of clear urine         Total IV Fluids:  500 ml  Fluid Deficit: 650 ml (from use of 2450 ml infusion)  Specimens: Leiomyoma specimens         Implants: None         Complications:  None; patient tolerated the procedure well.          Disposition: PACU - hemodynamically stable.         Condition: stable   Rubie Maid, MD [facility]

## 2022-05-22 NOTE — Anesthesia Procedure Notes (Signed)
Procedure Name: Intubation Date/Time: 05/22/2022 11:44 AM  Performed by: Doreen Salvage, CRNAPre-anesthesia Checklist: Patient identified, Emergency Drugs available, Suction available and Patient being monitored Patient Re-evaluated:Patient Re-evaluated prior to induction Oxygen Delivery Method: Circle system utilized Preoxygenation: Pre-oxygenation with 100% oxygen Induction Type: IV induction, Cricoid Pressure applied and Rapid sequence Ventilation: Mask ventilation without difficulty Laryngoscope Size: Mac and 3 Grade View: Grade II Tube type: Oral Tube size: 7.0 mm Number of attempts: 1 Airway Equipment and Method: Stylet Placement Confirmation: ETT inserted through vocal cords under direct vision, positive ETCO2 and breath sounds checked- equal and bilateral Secured at: 21 cm Tube secured with: Tape Dental Injury: Teeth and Oropharynx as per pre-operative assessment

## 2022-05-22 NOTE — Discharge Instructions (Signed)

## 2022-05-22 NOTE — OR Nursing (Signed)
In and out cath performed by Dr. Marcelline Mates. 0 mL output

## 2022-05-22 NOTE — Anesthesia Preprocedure Evaluation (Addendum)
Anesthesia Evaluation  Patient identified by MRN, date of birth, ID band Patient awake    Reviewed: Allergy & Precautions, NPO status , Patient's Chart, lab work & pertinent test results  History of Anesthesia Complications (+) PONV and history of anesthetic complications  Airway Mallampati: III   Neck ROM: Full    Dental no notable dental hx.    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   Anesthesia Other Findings   Reproductive/Obstetrics                             Anesthesia Physical Anesthesia Plan  ASA: 3  Anesthesia Plan: General   Post-op Pain Management:    Induction: Intravenous  PONV Risk Score and Plan: 4 or greater and Ondansetron, Dexamethasone, Treatment may vary due to age or medical condition, Scopolamine patch - Pre-op, Propofol infusion, TIVA and Midazolam  Airway Management Planned: Oral ETT  Additional Equipment:   Intra-op Plan:   Post-operative Plan: Extubation in OR  Informed Consent: I have reviewed the patients History and Physical, chart, labs and discussed the procedure including the risks, benefits and alternatives for the proposed anesthesia with the patient or authorized representative who has indicated his/her understanding and acceptance.     Dental advisory given  Plan Discussed with:  CRNA  Anesthesia Plan Comments: (Patient consented for risks of anesthesia including but not limited to:  - adverse reactions to medications - damage to eyes, teeth, lips or other oral mucosa - nerve damage due to positioning  - sore throat or hoarseness - damage to heart, brain, nerves, lungs, other parts of body or loss of life  Informed patient about role of CRNA in peri- and intra-operative care.  Patient voiced understanding.)        Anesthesia Quick Evaluation

## 2022-05-23 ENCOUNTER — Encounter: Payer: Self-pay | Admitting: Obstetrics and Gynecology

## 2022-05-24 LAB — SURGICAL PATHOLOGY

## 2022-06-07 NOTE — Progress Notes (Signed)
    OBSTETRICS/GYNECOLOGY POST-OPERATIVE CLINIC VISIT  Subjective:     Annette Leon is a 32 y.o.  G0P0000 female who presents to the clinic 2 weeks status post Hysteroscopy D&C  with partial Myomectomy (abandoned procedure). Procedure was for abnormal uterine bleeding and pelvic pain. Eating a regular diet without difficulty. Bowel movements are normal. The patient is not having any pain. Denies any bleeding at this time.   The following portions of the patient's history were reviewed and updated as appropriate: allergies, current medications, past family history, past medical history, past social history, past surgical history, and problem list.  Review of Systems Pertinent items are noted in HPI.   Objective:   LMP 05/10/2022 (Approximate) Comment: UPT neg DOS Body mass index is 37.19 kg/m. Blood pressure (!) 142/93, pulse 89, resp. rate 16, height 5\' 8"  (1.727 m), weight 244 lb 9.6 oz (110.9 kg), last menstrual period 05/10/2022.   General:  alert and no distress  Abdomen: soft, bowel sounds active, non-tender  Pelvis:   Deferred    Pathology:  A. ENDOMETRIAL MASS; EXCISION:  - FRAGMENTS OF BENIGN SUBMUCOSAL LEIOMYOMA WITH FOCAL INFARCTION AND  DEGENERATIVE CHANGES.  - OVERLYING WEAKLY PROLIFERATIVE ENDOMETRIUM WITH GLANDULAR AND STROMAL  BREAKDOWN.  - NEGATIVE FOR ATYPICAL HYPERPLASIA/EIN, AND MALIGNANCY.    Assessment:   Patient s/p  Hysteroscopy D&C with partial Myomectomy of aborting myoma Doing well postoperatively.   Plan:   1. Continue any current medications as instructed by provider. 2. Wound care discussed. 3. Operative findings again reviewed. Pathology report discussed.  Advised that some portion of the fibroid remained unresected (mostly the base of the pedunculated myoma) due to excessive dilation of the cervix from the aborting myoma increasing fluid deficit.  Discussed that if conception is desired, would recommend completion of the resection. If  conception was not desired in the new future, could hold on further resection unless she remains symptomatic. Patient would like to move forward with completion of the resection.  Discussed potential surgery dates, would like to have done on 4/29. Will schedule.  4. Activity restrictions: none 5. Anticipated return to work: not applicable. 6. Follow up: f/u 10-14 days after next surgery.     [facility]

## 2022-06-09 ENCOUNTER — Encounter: Payer: Self-pay | Admitting: Obstetrics and Gynecology

## 2022-06-09 ENCOUNTER — Ambulatory Visit (INDEPENDENT_AMBULATORY_CARE_PROVIDER_SITE_OTHER): Payer: BC Managed Care – PPO | Admitting: Obstetrics and Gynecology

## 2022-06-09 VITALS — BP 132/88 | HR 92 | Resp 16 | Ht 68.0 in | Wt 244.6 lb

## 2022-06-09 DIAGNOSIS — Z01818 Encounter for other preprocedural examination: Secondary | ICD-10-CM

## 2022-06-09 DIAGNOSIS — Z4889 Encounter for other specified surgical aftercare: Secondary | ICD-10-CM

## 2022-06-09 DIAGNOSIS — D219 Benign neoplasm of connective and other soft tissue, unspecified: Secondary | ICD-10-CM

## 2022-06-09 DIAGNOSIS — Z09 Encounter for follow-up examination after completed treatment for conditions other than malignant neoplasm: Secondary | ICD-10-CM

## 2022-06-09 DIAGNOSIS — N939 Abnormal uterine and vaginal bleeding, unspecified: Secondary | ICD-10-CM

## 2022-06-09 NOTE — H&P (View-Only) (Signed)
  GYNECOLOGY PRE-OPERATIVE HISTORY AND PHYSICAL  Subjective:    HPI  Patient is a 32 y.o. G0P0000 female who presents for management of Hysteroscopic Myomectomy. She underwent similar procedure on  05/22/2022, however an aborting myoma was noted, and after it's removal, further attempts at resecting the remaining portion of the fibroid had to be abandoned due to concerns for excessive fluid use and deficit due to high volume loss from excessively dilated cervix from myoma.  Procedure was for abnormal uterine bleeding and pelvic pain.  Desires removal of the remainder of the fibroid due to desires for conception, and concerns that the fibroid may increase chances of miscarriage.   The following portions of the patient's history were reviewed and updated as appropriate:   She  has a past medical history of Anxiety, HLD (hyperlipidemia), Hypertension, Mononucleosis, PONV (postoperative nausea and vomiting), Pre-diabetes, Splenic infarct (08/2021), and Type 2 diabetes mellitus (05/23/2016).  She  has a past surgical history that includes Salpingectomy; Cystectomy; Esophagogastroduodenoscopy (N/A, 07/13/2014); Hysteroscopy with D & C (N/A, 05/22/2022); and Myomectomy (N/A, 05/22/2022).  Her family history includes Atrial fibrillation in her maternal grandmother; Clotting disorder in her maternal grandmother; Diabetes in her sister and another family member; Hypertension in her father and mother; Other in her paternal grandmother; Stroke in her mother.  She  reports that she has never smoked. She has never used smokeless tobacco. She reports current alcohol use. She reports that she does not use drugs.  Current Outpatient Medications on File Prior to Visit  Medication Sig Dispense Refill   acetaminophen-codeine (TYLENOL #3) 300-30 MG tablet Take 1 tablet by mouth every 6 (six) hours as needed for moderate pain. 10 tablet 0   Blood Glucose Monitoring Suppl (ONETOUCH VERIO FLEX SYSTEM) w/Device KIT       FARXIGA 10 MG TABS tablet Take 10 mg by mouth daily.     glimepiride (AMARYL) 4 MG tablet Take 4 mg by mouth daily. Take every other day     ibuprofen (ADVIL) 800 MG tablet Take 1 tablet (800 mg total) by mouth every 8 (eight) hours as needed. 30 tablet 1   Lancets (ONETOUCH DELICA PLUS LANCET30G) MISC      metFORMIN (GLUCOPHAGE) 1000 MG tablet Take 1,000 mg by mouth 2 (two) times daily.     ONETOUCH VERIO test strip 1 each 3 (three) times daily.     propranolol ER (INDERAL LA) 60 MG 24 hr capsule Take 60 mg by mouth daily.     Semaglutide, 2 MG/DOSE, 8 MG/3ML SOPN Inject 2 mg into the skin once a week.     hydrochlorothiazide (HYDRODIURIL) 12.5 MG tablet Take 12.5 mg by mouth daily.     losartan (COZAAR) 100 MG tablet Take 100 mg by mouth daily.     No current facility-administered medications on file prior to visit.   She is allergic to latex, amoxicillin, cephalosporins, penicillin g, and penicillins..  Review of Systems Pertinent items are noted in HPI.   Objective:   Blood pressure 132/88, pulse 92, resp. rate 16, height 5' 8" (1.727 m), weight 244 lb 9.6 oz (110.9 kg), last menstrual period 05/10/2022.  CONSTITUTIONAL: Well-developed, well-nourished female in no acute distress.  HENT:  Normocephalic, atraumatic, External right and left ear normal. Oropharynx is clear and moist EYES: Conjunctivae and EOM are normal. Pupils are equal, round, and reactive to light. No scleral icterus.  NECK: Normal range of motion, supple, no masses SKIN: Skin is warm and dry. No rash noted. Not   diaphoretic. No erythema. No pallor. NEUROLOGIC: Alert and oriented to person, place, and time. Normal reflexes, muscle tone coordination. No cranial nerve deficit noted. PSYCHIATRIC: Normal mood and affect. Normal behavior. Normal judgment and thought content. CARDIOVASCULAR: Normal heart rate noted, regular rhythm RESPIRATORY: Effort and breath sounds normal, no problems with respiration noted ABDOMEN:  Soft, nontender, nondistended. PELVIC: Deferred MUSCULOSKELETAL: Normal range of motion. No edema and no tenderness. 2+ distal pulses.   Labs:  Results for orders placed or performed during the hospital encounter of 05/22/22  Glucose, capillary  Result Value Ref Range   Glucose-Capillary 324 (H) 70 - 99 mg/dL   Comment 1 Notify RN    Comment 2 Document in Chart   Glucose, capillary  Result Value Ref Range   Glucose-Capillary 306 (H) 70 - 99 mg/dL  Glucose, capillary  Result Value Ref Range   Glucose-Capillary 295 (H) 70 - 99 mg/dL  Pregnancy, urine POC  Result Value Ref Range   Preg Test, Ur NEGATIVE NEGATIVE  Surgical pathology  Result Value Ref Range   SURGICAL PATHOLOGY      SURGICAL PATHOLOGY CASE: [id number] PATIENT: Annette Leon Surgical Pathology Report     Specimen Submitted: A. Myoma, aborting  Clinical History: Abnormal uterine bleeding, endometrial polyp    DIAGNOSIS: A. ENDOMETRIAL MASS; EXCISION: - FRAGMENTS OF BENIGN SUBMUCOSAL LEIOMYOMA WITH FOCAL INFARCTION AND DEGENERATIVE CHANGES. - OVERLYING WEAKLY PROLIFERATIVE ENDOMETRIUM WITH GLANDULAR AND STROMAL BREAKDOWN. - NEGATIVE FOR ATYPICAL HYPERPLASIA/EIN, AND MALIGNANCY.  GROSS DESCRIPTION: A. Labeled: Aborting myoma Received: Fresh Collection time: 1:27 PM on 05/22/2022 Placed into formalin time: 3:33 PM on 05/22/2022 Tissue fragment(s): Multiple Size: Aggregate, 5.5 x 5 x 3 cm Description: Received are fragments of tan-pink firm tissue and blood clot.  The cut surfaces are white-tan tan-white, whorled, firm, and homogenous.  1 fragment has a central area of hemorrhage. Representative sections (1/cm) are submitted in cassettes 1-4 with the area of hemorrh age in cassette 1 and additional representative sections in cassettes 2-4.  RB 05/23/2022  Final Diagnosis performed by Heath Jones, MD.   Electronically signed 05/24/2022 9:18:28AM The electronic signature indicates that the named  Attending Pathologist has evaluated the specimen Technical component performed at [facility], [address], [place], [place] [address] Lab: [phone] Dir: Sanjai Nagendra, MD, MMM  Professional component performed at [facility], [facility], [address], , [place] [address] Lab: [phone] Dir: Heath M. Jones, MD    Lab Results  Component Value Date   WBC 7.8 05/19/2022   HGB 13.3 05/19/2022   HCT 40.4 05/19/2022   MCV 86.0 05/19/2022   PLT 267 05/19/2022    Lab Results  Component Value Date   HGBA1C 12.1 (H) 05/19/2022     ULTRASOUND:  CLINICAL DATA:  Abnormal uterine bleeding, leiomyomata, menorrhagia, LMP 04/17/2022   EXAM: TRANSABDOMINAL AND TRANSVAGINAL ULTRASOUND OF PELVIS   TECHNIQUE: Both transabdominal and transvaginal ultrasound examinations of the pelvis were performed. Transabdominal technique was performed for global imaging of the pelvis including uterus, ovaries, adnexal regions, and pelvic cul-de-sac. It was necessary to proceed with endovaginal exam following the transabdominal exam to visualize the lower uterine segment/cervix and ovaries.   COMPARISON:  None Available.   FINDINGS: Uterus   Measurements: 9.2 x 3.8 x 4.6 cm = volume: 84 mL. Anteverted. Heterogeneous myometrium. No myometrial mass.   Endometrium   Thickness: 9 mm. Teardrop shaped mass identified at the lower uterine segment, 2.0 x 1.5 x 2.1 cm, with a stalk extending up the lower   uterine segment endometrial canal. This appears to represent a prolapsed endometrial polyp. Vascular stalk is identified on color Doppler imaging.   Right ovary   Measurements: 3.0 x 1.5 x 2.9 cm = volume: 6.6 mL. Normal morphology without mass   Left ovary   Measurements: 2.5 x 2.2 x 2.8 cm = volume: 7.9 mL. Normal morphology without mass   Other findings   No free pelvic fluid or adnexal masses.   IMPRESSION: Probable prolapsed endometrial polyp at the lower  uterine segment, 2.1 cm greatest size extending into cervix; consider sonohysterogram for further evaluation, prior to hysteroscopy or endometrial biopsy.     Assessment:    Dysfunctional uterine bleeding  Uterine fibroid Type II DM (uncontrolled), without long-term use of insulin.    Plan:   - Discussion had regarding uterine fibroid  In the setting of abnormal uterine bleeding and future desires for conception, it is recommended for complete removal.  Patient notes understanding.  She has given consent for the completion of the procedure with hysteroscopic resection. Risks and benefits of procedure discussed in detail.  - Type II DM uncontrolled currently, despite multiple agents. Plans to be seen by her Endocrinologist soon for further management.      Annette Bishara, MD  [facility]  

## 2022-06-09 NOTE — Patient Instructions (Signed)
GYNECOLOGY PRE-OPERATIVE INSTRUCTIONS  You are scheduled for surgery on 06/26/2022.  The name of your procedure is: Hysteroscopic Myomectomy.   Please read through these instructions carefully regarding preparation for your surgery: Nothing to eat after midnight on the day prior to surgery.  Do not take any medications unless recommended by your provider on day prior to surgery.  Do not take NSAIDs (Motrin, Aleve) or aspirin 7 days prior to surgery.  You may take Tylenol products for minor aches and pains.  You will receive a prescription for pain medications post-operatively.  You will be contacted by phone approximately 1-2 weeks prior to surgery to schedule your pre-operative appointment.  Please call the office if you have any questions regarding your upcoming surgery.    Thank you for choosing [facility] at [facility].

## 2022-06-09 NOTE — H&P (Signed)
GYNECOLOGY PRE-OPERATIVE HISTORY AND PHYSICAL  Subjective:    HPI  Patient is a 32 y.o. G0P0000 female who presents for management of Hysteroscopic Myomectomy. She underwent similar procedure on  05/22/2022, however an aborting myoma was noted, and after it's removal, further attempts at resecting the remaining portion of the fibroid had to be abandoned due to concerns for excessive fluid use and deficit due to high volume loss from excessively dilated cervix from myoma.  Procedure was for abnormal uterine bleeding and pelvic pain.  Desires removal of the remainder of the fibroid due to desires for conception, and concerns that the fibroid may increase chances of miscarriage.   The following portions of the patient's history were reviewed and updated as appropriate:   She  has a past medical history of Anxiety, HLD (hyperlipidemia), Hypertension, Mononucleosis, PONV (postoperative nausea and vomiting), Pre-diabetes, Splenic infarct (08/2021), and Type 2 diabetes mellitus (05/23/2016).  She  has a past surgical history that includes Salpingectomy; Cystectomy; Esophagogastroduodenoscopy (N/A, 07/13/2014); Hysteroscopy with D & C (N/A, 05/22/2022); and Myomectomy (N/A, 05/22/2022).  Her family history includes Atrial fibrillation in her maternal grandmother; Clotting disorder in her maternal grandmother; Diabetes in her sister and another family member; Hypertension in her father and mother; Other in her paternal grandmother; Stroke in her mother.  She  reports that she has never smoked. She has never used smokeless tobacco. She reports current alcohol use. She reports that she does not use drugs.  Current Outpatient Medications on File Prior to Visit  Medication Sig Dispense Refill   acetaminophen-codeine (TYLENOL #3) 300-30 MG tablet Take 1 tablet by mouth every 6 (six) hours as needed for moderate pain. 10 tablet 0   Blood Glucose Monitoring Suppl (ONETOUCH VERIO FLEX SYSTEM) w/Device KIT       FARXIGA 10 MG TABS tablet Take 10 mg by mouth daily.     glimepiride (AMARYL) 4 MG tablet Take 4 mg by mouth daily. Take every other day     ibuprofen (ADVIL) 800 MG tablet Take 1 tablet (800 mg total) by mouth every 8 (eight) hours as needed. 30 tablet 1   Lancets (ONETOUCH DELICA PLUS LANCET30G) MISC      metFORMIN (GLUCOPHAGE) 1000 MG tablet Take 1,000 mg by mouth 2 (two) times daily.     ONETOUCH VERIO test strip 1 each 3 (three) times daily.     propranolol ER (INDERAL LA) 60 MG 24 hr capsule Take 60 mg by mouth daily.     Semaglutide, 2 MG/DOSE, 8 MG/3ML SOPN Inject 2 mg into the skin once a week.     hydrochlorothiazide (HYDRODIURIL) 12.5 MG tablet Take 12.5 mg by mouth daily.     losartan (COZAAR) 100 MG tablet Take 100 mg by mouth daily.     No current facility-administered medications on file prior to visit.   She is allergic to latex, amoxicillin, cephalosporins, penicillin g, and penicillins..  Review of Systems Pertinent items are noted in HPI.   Objective:   Blood pressure 132/88, pulse 92, resp. rate 16, height 5\' 8"  (1.727 m), weight 244 lb 9.6 oz (110.9 kg), last menstrual period 05/10/2022.  CONSTITUTIONAL: Well-developed, well-nourished female in no acute distress.  HENT:  Normocephalic, atraumatic, External right and left ear normal. Oropharynx is clear and moist EYES: Conjunctivae and EOM are normal. Pupils are equal, round, and reactive to light. No scleral icterus.  NECK: Normal range of motion, supple, no masses SKIN: Skin is warm and dry. No rash noted. Not  diaphoretic. No erythema. No pallor. NEUROLOGIC: Alert and oriented to person, place, and time. Normal reflexes, muscle tone coordination. No cranial nerve deficit noted. PSYCHIATRIC: Normal mood and affect. Normal behavior. Normal judgment and thought content. CARDIOVASCULAR: Normal heart rate noted, regular rhythm RESPIRATORY: Effort and breath sounds normal, no problems with respiration noted ABDOMEN:  Soft, nontender, nondistended. PELVIC: Deferred MUSCULOSKELETAL: Normal range of motion. No edema and no tenderness. 2+ distal pulses.   Labs:  Results for orders placed or performed during the hospital encounter of 05/22/22  Glucose, capillary  Result Value Ref Range   Glucose-Capillary 324 (H) 70 - 99 mg/dL   Comment 1 Notify RN    Comment 2 Document in Chart   Glucose, capillary  Result Value Ref Range   Glucose-Capillary 306 (H) 70 - 99 mg/dL  Glucose, capillary  Result Value Ref Range   Glucose-Capillary 295 (H) 70 - 99 mg/dL  Pregnancy, urine POC  Result Value Ref Range   Preg Test, Ur NEGATIVE NEGATIVE  Surgical pathology  Result Value Ref Range   SURGICAL PATHOLOGY      SURGICAL PATHOLOGY CASE: [id number] PATIENT: Annette Leon Surgical Pathology Report     Specimen Submitted: A. Myoma, aborting  Clinical History: Abnormal uterine bleeding, endometrial polyp    DIAGNOSIS: A. ENDOMETRIAL MASS; EXCISION: - FRAGMENTS OF BENIGN SUBMUCOSAL LEIOMYOMA WITH FOCAL INFARCTION AND DEGENERATIVE CHANGES. - OVERLYING WEAKLY PROLIFERATIVE ENDOMETRIUM WITH GLANDULAR AND STROMAL BREAKDOWN. - NEGATIVE FOR ATYPICAL HYPERPLASIA/EIN, AND MALIGNANCY.  GROSS DESCRIPTION: A. Labeled: Aborting myoma Received: Fresh Collection time: 1:27 PM on 05/22/2022 Placed into formalin time: 3:33 PM on 05/22/2022 Tissue fragment(s): Multiple Size: Aggregate, 5.5 x 5 x 3 cm Description: Received are fragments of tan-pink firm tissue and blood clot.  The cut surfaces are white-tan tan-white, whorled, firm, and homogenous.  1 fragment has a central area of hemorrhage. Representative sections (1/cm) are submitted in cassettes 1-4 with the area of hemorrh age in cassette 1 and additional representative sections in cassettes 2-4.  RB 05/23/2022  Final Diagnosis performed by Katherine Mantle, MD.   Electronically signed 05/24/2022 9:18:28AM The electronic signature indicates that the named  Attending Pathologist has evaluated the specimen Technical component performed at [facility], [address] [place], [place] [address] Lab: [phone] Dir: Jolene Schimke, MD, MMM  Professional component performed at [facility], [facility], [address] [place], [place], [place] [address] Lab: [phone] Dir: Beryle Quant, MD    Lab Results  Component Value Date   WBC 7.8 05/19/2022   HGB 13.3 05/19/2022   HCT 40.4 05/19/2022   MCV 86.0 05/19/2022   PLT 267 05/19/2022    Lab Results  Component Value Date   HGBA1C 12.1 (H) 05/19/2022     ULTRASOUND:  CLINICAL DATA:  Abnormal uterine bleeding, leiomyomata, menorrhagia, LMP 04/17/2022   EXAM: TRANSABDOMINAL AND TRANSVAGINAL ULTRASOUND OF PELVIS   TECHNIQUE: Both transabdominal and transvaginal ultrasound examinations of the pelvis were performed. Transabdominal technique was performed for global imaging of the pelvis including uterus, ovaries, adnexal regions, and pelvic cul-de-sac. It was necessary to proceed with endovaginal exam following the transabdominal exam to visualize the lower uterine segment/cervix and ovaries.   COMPARISON:  None Available.   FINDINGS: Uterus   Measurements: 9.2 x 3.8 x 4.6 cm = volume: 84 mL. Anteverted. Heterogeneous myometrium. No myometrial mass.   Endometrium   Thickness: 9 mm. Teardrop shaped mass identified at the lower uterine segment, 2.0 x 1.5 x 2.1 cm, with a stalk extending up the lower  uterine segment endometrial canal. This appears to represent a prolapsed endometrial polyp. Vascular stalk is identified on color Doppler imaging.   Right ovary   Measurements: 3.0 x 1.5 x 2.9 cm = volume: 6.6 mL. Normal morphology without mass   Left ovary   Measurements: 2.5 x 2.2 x 2.8 cm = volume: 7.9 mL. Normal morphology without mass   Other findings   No free pelvic fluid or adnexal masses.   IMPRESSION: Probable prolapsed endometrial polyp at the lower  uterine segment, 2.1 cm greatest size extending into cervix; consider sonohysterogram for further evaluation, prior to hysteroscopy or endometrial biopsy.     Assessment:    Dysfunctional uterine bleeding  Uterine fibroid Type II DM (uncontrolled), without long-term use of insulin.    Plan:   - Discussion had regarding uterine fibroid  In the setting of abnormal uterine bleeding and future desires for conception, it is recommended for complete removal.  Patient notes understanding.  She has given consent for the completion of the procedure with hysteroscopic resection. Risks and benefits of procedure discussed in detail.  - Type II DM uncontrolled currently, despite multiple agents. Plans to be seen by her Endocrinologist soon for further management.      Hildred Laser, MD [facility]

## 2022-06-16 ENCOUNTER — Encounter
Admission: RE | Admit: 2022-06-16 | Discharge: 2022-06-16 | Disposition: A | Discharge status: Home / Self Care | Payer: BC Managed Care – PPO | Source: Ambulatory Visit | Attending: Obstetrics and Gynecology | Admitting: Obstetrics and Gynecology

## 2022-06-16 VITALS — Ht 68.0 in | Wt 240.0 lb

## 2022-06-16 DIAGNOSIS — Z01812 Encounter for preprocedural laboratory examination: Secondary | ICD-10-CM

## 2022-06-16 DIAGNOSIS — I10 Essential (primary) hypertension: Secondary | ICD-10-CM

## 2022-06-16 NOTE — Patient Instructions (Addendum)
Your procedure is scheduled on: Monday, April 29 Report to the Registration Desk on the 1st floor of the [facility] To find out your arrival time, please call [phone] between 1PM - 3PM on: Friday, April 26 If your arrival time is 6:00 am, do not arrive before that time as the [facility] entrance doors do not open until 6:00 am.  REMEMBER: Instructions that are not followed completely may result in serious medical risk, up to and including death; or upon the discretion of your surgeon and anesthesiologist your surgery may need to be rescheduled.  Do not eat or drink after midnight the night before surgery.  No gum chewing or hard candies.  One week prior to surgery: starting April 22 Stop Anti-inflammatories (NSAIDS) such as Advil, Aleve, Ibuprofen, Motrin, Naproxen, Naprosyn and Aspirin based products such as Excedrin, Goody's Powder, BC Powder. Stop ANY OVER THE COUNTER supplements until after surgery. You may however, continue to take Tylenol if needed for pain up until the day of surgery.  Continue taking all prescribed medications with the exception of the following:  Farxiga hold for 3 days before surgery. Last day to take is Thursday, April 25. Resume AFTER surgery.  Metformin hold for 2 days before surgery. Last day to take is Friday, April 26. Resume AFTER surgery.  Ozempic (semaglutide) hold for 7 days before surgery. Do NOT take Ozempic on Sunday, April 28. Resume AFTER surgery on your regular day, Sunday May 5.  TAKE ONLY THESE MEDICATIONS THE MORNING OF SURGERY WITH A SIP OF WATER:  Propranolol  No Alcohol for 24 hours before or after surgery.  No Smoking including e-cigarettes for 24 hours before surgery.  No chewable tobacco products for at least 6 hours before surgery.  No nicotine patches on the day of surgery.  Do not use any "recreational" drugs for at least a week (preferably 2 weeks) before your surgery.  Please be advised that the combination of  cocaine and anesthesia may have negative outcomes, up to and including death. If you test positive for cocaine, your surgery will be cancelled.  On the morning of surgery brush your teeth with toothpaste and water, you may rinse your mouth with mouthwash if you wish. Do not swallow any toothpaste or mouthwash.  Do not wear jewelry, make-up, hairpins, clips or nail polish.  Do not wear lotions, powders, or perfumes.   Do not shave body hair from the neck down 48 hours before surgery.  Contact lenses, hearing aids and dentures may not be worn into surgery.  Do not bring valuables to the hospital. [facility] is not responsible for any missing/lost belongings or valuables.   Notify your doctor if there is any change in your medical condition (cold, fever, infection).  Wear comfortable clothing (specific to your surgery type) to the hospital.  After surgery, you can help prevent lung complications by doing breathing exercises.  Take deep breaths and cough every 1-2 hours. Your doctor may order a device called an Incentive Spirometer to help you take deep breaths.  If you are being discharged the day of surgery, you will not be allowed to drive home. You will need a responsible individual to drive you home and stay with you for 24 hours after surgery.   If you are taking public transportation, you will need to have a responsible individual with you.  Please call the Pre-admissions Testing Dept. at [phone] if you have any questions about these instructions.  Surgery Visitation Policy:  Patients having surgery or a procedure may have two visitors.  Children under the age of 61 must have an adult with them who is not the patient.

## 2022-06-19 ENCOUNTER — Encounter: Payer: Self-pay | Admitting: Urgent Care

## 2022-06-19 ENCOUNTER — Encounter
Admission: RE | Admit: 2022-06-19 | Discharge: 2022-06-19 | Disposition: A | Discharge status: Home / Self Care | Payer: BC Managed Care – PPO | Source: Ambulatory Visit | Attending: Obstetrics and Gynecology | Admitting: Obstetrics and Gynecology

## 2022-06-19 DIAGNOSIS — I10 Essential (primary) hypertension: Secondary | ICD-10-CM

## 2022-06-19 DIAGNOSIS — Z01812 Encounter for preprocedural laboratory examination: Secondary | ICD-10-CM

## 2022-06-19 DIAGNOSIS — Z01818 Encounter for other preprocedural examination: Secondary | ICD-10-CM

## 2022-06-19 LAB — BASIC METABOLIC PANEL
Anion gap: 10 (ref 5–15)
BUN: 12 mg/dL (ref 6–20)
CO2: 22 mmol/L (ref 22–32)
Calcium: 8.8 mg/dL — ABNORMAL LOW (ref 8.9–10.3)
Chloride: 102 mmol/L (ref 98–111)
Creatinine, Ser: 0.47 mg/dL (ref 0.44–1.00)
GFR, Estimated: >60 mL/min (ref >60)
Glucose, Bld: 324 mg/dL — ABNORMAL HIGH (ref 70–99)
Potassium: 3.8 mmol/L (ref 3.5–5.1)
Sodium: 134 mmol/L — ABNORMAL LOW (ref 135–145)

## 2022-06-19 LAB — CBC
HCT: 39.2 % (ref 36.0–46.0)
Hemoglobin: 12.3 g/dL (ref 12.0–15.0)
MCH: 26.4 pg (ref 26.0–34.0)
MCHC: 31.4 g/dL (ref 30.0–36.0)
MCV: 84.1 fL (ref 80.0–100.0)
Platelets: 293 10*3/uL (ref 150–400)
RBC: 4.66 MIL/uL (ref 3.87–5.11)
RDW: 13 % (ref 11.5–15.5)
WBC: 7.5 10*3/uL (ref 4.0–10.5)
nRBC: 0 % (ref 0.0–0.2)

## 2022-06-19 LAB — TYPE AND SCREEN
ABO/RH(D): O NEG
Antibody Screen: NEGATIVE

## 2022-06-21 NOTE — Progress Notes (Signed)
Previous fax to Dr. Gershon Crane (endocrine) was received back. This form was to inform the endocrinologist of the patient's upcoming surgery and they are instructed to hold the Ozempic for 7 days. Received fax back from Dr. Gershon Crane (endocrinology) indicating that the patient should contact their office to make an appointment to adjust the diabetes regime. Call to patient, detailed voicemail left informing of this.

## 2022-06-25 MED ORDER — ORAL CARE MOUTH RINSE: 15.0000 mL | Freq: Once | OROMUCOSAL | Status: AC

## 2022-06-25 MED ORDER — LACTATED RINGERS IV SOLN: INTRAVENOUS | Status: DC

## 2022-06-25 MED ORDER — CELECOXIB 200 MG PO CAPS
400.0000 mg | ORAL_CAPSULE | ORAL | Status: AC
  Administered 2022-06-26: 400 mg via ORAL

## 2022-06-25 MED ORDER — SODIUM CHLORIDE 0.9 % IV SOLN: INTRAVENOUS | Status: DC

## 2022-06-25 MED ORDER — ACETAMINOPHEN 500 MG PO TABS
1000.0000 mg | ORAL_TABLET | ORAL | Status: AC
  Administered 2022-06-26: 1000 mg via ORAL

## 2022-06-25 MED ORDER — CHLORHEXIDINE GLUCONATE 0.12 % MT SOLN
15.0000 mL | Freq: Once | OROMUCOSAL | Status: AC
  Administered 2022-06-26: 15 mL via OROMUCOSAL

## 2022-06-25 MED ORDER — FAMOTIDINE 20 MG PO TABS
20.0000 mg | ORAL_TABLET | Freq: Once | ORAL | Status: AC
  Administered 2022-06-26: 20 mg via ORAL

## 2022-06-26 ENCOUNTER — Encounter
Admission: RE | Disposition: A | Discharge status: Home / Self Care | Payer: Self-pay | Source: Home / Self Care | Attending: Obstetrics and Gynecology

## 2022-06-26 ENCOUNTER — Other Ambulatory Visit: Payer: Self-pay

## 2022-06-26 ENCOUNTER — Encounter: Payer: Self-pay | Admitting: Obstetrics and Gynecology

## 2022-06-26 ENCOUNTER — Ambulatory Visit
Admission: RE | Admit: 2022-06-26 | Discharge: 2022-06-26 | Disposition: A | Discharge status: Home / Self Care | Payer: BC Managed Care – PPO | Attending: Obstetrics and Gynecology | Admitting: Obstetrics and Gynecology

## 2022-06-26 ENCOUNTER — Ambulatory Visit: Payer: BC Managed Care – PPO | Admitting: Anesthesiology

## 2022-06-26 DIAGNOSIS — E119 Type 2 diabetes mellitus without complications: Secondary | ICD-10-CM | POA: Insufficient documentation

## 2022-06-26 DIAGNOSIS — F419 Anxiety disorder, unspecified: Secondary | ICD-10-CM | POA: Diagnosis not present

## 2022-06-26 DIAGNOSIS — R102 Pelvic and perineal pain: Secondary | ICD-10-CM | POA: Insufficient documentation

## 2022-06-26 DIAGNOSIS — N920 Excessive and frequent menstruation with regular cycle: Secondary | ICD-10-CM | POA: Insufficient documentation

## 2022-06-26 DIAGNOSIS — N938 Other specified abnormal uterine and vaginal bleeding: Secondary | ICD-10-CM | POA: Insufficient documentation

## 2022-06-26 DIAGNOSIS — Z01818 Encounter for other preprocedural examination: Secondary | ICD-10-CM

## 2022-06-26 DIAGNOSIS — D250 Submucous leiomyoma of uterus: Secondary | ICD-10-CM | POA: Diagnosis present

## 2022-06-26 DIAGNOSIS — I10 Essential (primary) hypertension: Secondary | ICD-10-CM | POA: Diagnosis not present

## 2022-06-26 DIAGNOSIS — D259 Leiomyoma of uterus, unspecified: Secondary | ICD-10-CM

## 2022-06-26 DIAGNOSIS — E785 Hyperlipidemia, unspecified: Secondary | ICD-10-CM | POA: Diagnosis not present

## 2022-06-26 DIAGNOSIS — Z7984 Long term (current) use of oral hypoglycemic drugs: Secondary | ICD-10-CM | POA: Diagnosis not present

## 2022-06-26 DIAGNOSIS — Z01812 Encounter for preprocedural laboratory examination: Secondary | ICD-10-CM

## 2022-06-26 DIAGNOSIS — D219 Benign neoplasm of connective and other soft tissue, unspecified: Secondary | ICD-10-CM

## 2022-06-26 HISTORY — PX: MYOMECTOMY: SHX85

## 2022-06-26 LAB — GLUCOSE, CAPILLARY
Glucose-Capillary: 314 mg/dL — ABNORMAL HIGH (ref 70–99)
Glucose-Capillary: 273 mg/dL — ABNORMAL HIGH (ref 70–99)

## 2022-06-26 LAB — POCT PREGNANCY, URINE: Preg Test, Ur: NEGATIVE

## 2022-06-26 LAB — ABO/RH: ABO/RH(D): O NEG

## 2022-06-26 SURGERY — MYOMECTOMY, UTERUS, VAGINAL APPROACH
Anesthesia: General | Site: Vagina

## 2022-06-26 MED ORDER — INSULIN ASPART 100 UNIT/ML IJ SOLN
INTRAMUSCULAR | Status: AC
  Filled 2022-06-26: qty 1

## 2022-06-26 MED ORDER — FENTANYL CITRATE (PF) 100 MCG/2ML IJ SOLN
INTRAMUSCULAR | Status: AC
  Filled 2022-06-26: qty 2

## 2022-06-26 MED ORDER — DEXAMETHASONE SODIUM PHOSPHATE 10 MG/ML IJ SOLN
INTRAMUSCULAR | Status: AC
  Filled 2022-06-26: qty 1

## 2022-06-26 MED ORDER — DEXMEDETOMIDINE HCL IN NACL 200 MCG/50ML IV SOLN
INTRAVENOUS | Status: DC | PRN
  Administered 2022-06-26: 12 ug via INTRAVENOUS

## 2022-06-26 MED ORDER — DROPERIDOL 2.5 MG/ML IJ SOLN: 0.6250 mg | Freq: Once | INTRAMUSCULAR | Status: DC | PRN

## 2022-06-26 MED ORDER — IBUPROFEN 600 MG PO TABS: 600.0000 mg | ORAL_TABLET | Freq: Four times a day (QID) | ORAL | 1 refills | Status: DC | PRN

## 2022-06-26 MED ORDER — CHLORHEXIDINE GLUCONATE 0.12 % MT SOLN
OROMUCOSAL | Status: AC
  Filled 2022-06-26: qty 15

## 2022-06-26 MED ORDER — ROCURONIUM BROMIDE 100 MG/10ML IV SOLN
INTRAVENOUS | Status: DC | PRN
  Administered 2022-06-26 (×2): 20 mg via INTRAVENOUS

## 2022-06-26 MED ORDER — FAMOTIDINE 20 MG PO TABS
ORAL_TABLET | ORAL | Status: AC
  Filled 2022-06-26: qty 1

## 2022-06-26 MED ORDER — FENTANYL CITRATE (PF) 100 MCG/2ML IJ SOLN
25.0000 ug | INTRAMUSCULAR | Status: DC | PRN
  Administered 2022-06-26: 25 ug via INTRAVENOUS

## 2022-06-26 MED ORDER — ONDANSETRON HCL 4 MG/2ML IJ SOLN
INTRAMUSCULAR | Status: AC
  Filled 2022-06-26: qty 2

## 2022-06-26 MED ORDER — OXYCODONE HCL 5 MG/5ML PO SOLN: 5.0000 mg | Freq: Once | ORAL | Status: AC | PRN

## 2022-06-26 MED ORDER — LIDOCAINE HCL (CARDIAC) PF 100 MG/5ML IV SOSY
PREFILLED_SYRINGE | INTRAVENOUS | Status: DC | PRN
  Administered 2022-06-26: 100 mg via INTRAVENOUS

## 2022-06-26 MED ORDER — OXYCODONE HCL 5 MG PO TABS
ORAL_TABLET | ORAL | Status: AC
  Filled 2022-06-26: qty 1

## 2022-06-26 MED ORDER — MIDAZOLAM HCL 2 MG/2ML IJ SOLN
INTRAMUSCULAR | Status: AC
  Filled 2022-06-26: qty 2

## 2022-06-26 MED ORDER — DROPERIDOL 2.5 MG/ML IJ SOLN
INTRAMUSCULAR | Status: AC
  Filled 2022-06-26: qty 2

## 2022-06-26 MED ORDER — ACETAMINOPHEN 500 MG PO TABS
ORAL_TABLET | ORAL | Status: AC
  Filled 2022-06-26: qty 2

## 2022-06-26 MED ORDER — DEXAMETHASONE SODIUM PHOSPHATE 10 MG/ML IJ SOLN
INTRAMUSCULAR | Status: DC | PRN
  Administered 2022-06-26: 10 mg via INTRAVENOUS

## 2022-06-26 MED ORDER — SILVER NITRATE-POT NITRATE 75-25 % EX MISC
CUTANEOUS | Status: DC | PRN
  Administered 2022-06-26: 2 via TOPICAL

## 2022-06-26 MED ORDER — SCOPOLAMINE 1 MG/3DAYS TD PT72
MEDICATED_PATCH | TRANSDERMAL | Status: AC
  Filled 2022-06-26: qty 1

## 2022-06-26 MED ORDER — METOPROLOL TARTRATE 5 MG/5ML IV SOLN
INTRAVENOUS | Status: DC | PRN
  Administered 2022-06-26 (×2): 2 mg via INTRAVENOUS

## 2022-06-26 MED ORDER — PROMETHAZINE HCL 25 MG/ML IJ SOLN: 6.2500 mg | INTRAMUSCULAR | Status: DC | PRN

## 2022-06-26 MED ORDER — OXYCODONE HCL 5 MG PO TABS
5.0000 mg | ORAL_TABLET | Freq: Once | ORAL | Status: AC | PRN
  Administered 2022-06-26: 5 mg via ORAL

## 2022-06-26 MED ORDER — FENTANYL CITRATE (PF) 100 MCG/2ML IJ SOLN
INTRAMUSCULAR | Status: DC | PRN
  Administered 2022-06-26 (×2): 50 ug via INTRAVENOUS

## 2022-06-26 MED ORDER — SUGAMMADEX SODIUM 200 MG/2ML IV SOLN
INTRAVENOUS | Status: DC | PRN
  Administered 2022-06-26: 200 mg via INTRAVENOUS

## 2022-06-26 MED ORDER — PROPOFOL 500 MG/50ML IV EMUL
INTRAVENOUS | Status: DC | PRN
  Administered 2022-06-26: 140 ug/kg/min via INTRAVENOUS

## 2022-06-26 MED ORDER — LIDOCAINE HCL (PF) 2 % IJ SOLN
INTRAMUSCULAR | Status: AC
  Filled 2022-06-26: qty 5

## 2022-06-26 MED ORDER — SUCCINYLCHOLINE CHLORIDE 200 MG/10ML IV SOSY
PREFILLED_SYRINGE | INTRAVENOUS | Status: DC | PRN
  Administered 2022-06-26: 80 mg via INTRAVENOUS

## 2022-06-26 MED ORDER — ACETAMINOPHEN 10 MG/ML IV SOLN: 1000.0000 mg | Freq: Once | INTRAVENOUS | Status: DC | PRN

## 2022-06-26 MED ORDER — MIDAZOLAM HCL 2 MG/2ML IJ SOLN
INTRAMUSCULAR | Status: DC | PRN
  Administered 2022-06-26: 2 mg via INTRAVENOUS

## 2022-06-26 MED ORDER — PROPOFOL 1000 MG/100ML IV EMUL
INTRAVENOUS | Status: AC
  Filled 2022-06-26: qty 100

## 2022-06-26 MED ORDER — INSULIN ASPART 100 UNIT/ML IJ SOLN
5.0000 [IU] | INTRAMUSCULAR | Status: AC
  Administered 2022-06-26: 5 [IU] via SUBCUTANEOUS

## 2022-06-26 MED ORDER — SCOPOLAMINE 1 MG/3DAYS TD PT72
1.0000 | MEDICATED_PATCH | TRANSDERMAL | Status: DC
  Administered 2022-06-26: 1.5 mg via TRANSDERMAL

## 2022-06-26 MED ORDER — PROPOFOL 10 MG/ML IV BOLUS
INTRAVENOUS | Status: AC
  Filled 2022-06-26: qty 20

## 2022-06-26 MED ORDER — CELECOXIB 200 MG PO CAPS
ORAL_CAPSULE | ORAL | Status: AC
  Filled 2022-06-26: qty 2

## 2022-06-26 SURGICAL SUPPLY — 27 items
ACCESSORY FLUID MGT SYMPHION (MISCELLANEOUS) IMPLANT
BASIN GRAD PLASTIC 32OZ STRL (MISCELLANEOUS) IMPLANT
DEVICE FLUID MGT SYMPHION (MISCELLANEOUS) IMPLANT
DEVICE MYOSURE LITE (MISCELLANEOUS) IMPLANT
DRSG TELFA 3X8 NADH STRL (GAUZE/BANDAGES/DRESSINGS) IMPLANT
ELECT REM PT RETURN 9FT ADLT (ELECTROSURGICAL) ×1
ELECTRODE REM PT RTRN 9FT ADLT (ELECTROSURGICAL) ×2 IMPLANT
GLOVE BIO SURGEON STRL SZ 6.5 (GLOVE) ×2 IMPLANT
GOWN STRL REUS W/ TWL LRG LVL3 (GOWN DISPOSABLE) ×4 IMPLANT
GOWN STRL REUS W/TWL LRG LVL3 (GOWN DISPOSABLE) ×2
HANDPIECE ABLA MINERVA ENDO (MISCELLANEOUS) IMPLANT
IV NS IRRIG 3000ML ARTHROMATIC (IV SOLUTION) ×2 IMPLANT
KIT PROCEDURE FLUENT (KITS) IMPLANT
KIT TURNOVER CYSTO (KITS) ×2 IMPLANT
MANIFOLD NEPTUNE II (INSTRUMENTS) ×2 IMPLANT
NDL HYPO 22X1.5 SAFETY MO (MISCELLANEOUS) IMPLANT
NEEDLE HYPO 22X1.5 SAFETY MO (MISCELLANEOUS) IMPLANT
PACK DNC HYST (MISCELLANEOUS) ×2 IMPLANT
PAD PREP OB/GYN DISP 24X41 (PERSONAL CARE ITEMS) ×2 IMPLANT
SCRUB CHG 4% DYNA-HEX 4OZ (MISCELLANEOUS) ×2 IMPLANT
SEAL ROD LENS SCOPE MYOSURE (ABLATOR) ×2 IMPLANT
SET CYSTO W/LG BORE CLAMP LF (SET/KITS/TRAYS/PACK) IMPLANT
SUT VIC AB 4-0 SH 27 (SUTURE) ×1
SUT VIC AB 4-0 SH 27XANBCTRL (SUTURE) IMPLANT
SYR 10ML LL (SYRINGE) IMPLANT
TRAP FLUID SMOKE EVACUATOR (MISCELLANEOUS) ×2 IMPLANT
WATER STERILE IRR 500ML POUR (IV SOLUTION) ×2 IMPLANT

## 2022-06-26 NOTE — Anesthesia Postprocedure Evaluation (Signed)
Anesthesia Post Note  Patient: Annette Leon  Procedure(s) Performed: HYSTEROSCOPIC MYOMECTOMY (Vagina )  Patient location during evaluation: PACU Anesthesia Type: General Level of consciousness: awake and alert Pain management: pain level controlled Vital Signs Assessment: post-procedure vital signs reviewed and stable Respiratory status: spontaneous breathing, nonlabored ventilation and respiratory function stable Cardiovascular status: blood pressure returned to baseline and stable Postop Assessment: no apparent nausea or vomiting Anesthetic complications: no   No notable events documented.   Last Vitals:  Vitals:   06/26/22 1115 06/26/22 1137  BP: 108/77 114/84  Pulse: 66 68  Resp: 15 20  Temp:  (!) 36.1 C  SpO2: 92% 100%    Last Pain:  Vitals:   06/26/22 1137  TempSrc: Temporal  PainSc: 0-No pain                 Foye Deer

## 2022-06-26 NOTE — Op Note (Signed)
Procedure(s): HYSTEROSCOPIC MYOMECTOMY Procedure Note  TAYANA SHANKLE female 32 y.o. 06/26/2022  Indications: The patient is a 32 y.o. G0P0000 female with history of pedunculated  uterine leiomyoma, abnormal uterine bleeding and pelvic pain. Previously had partial resection of aborting myoma 2 weeks ago, unable to complete procedure then due to inability to maintain adequate visualization due to excessive fluid loss from significantly dilated cervix.   Pre-operative Diagnosis: Uterine pedunculated leiomyoma  Post-operative Diagnosis: Same  Surgeon: Hildred Laser, MD  Assistants:  None.   Anesthesia: General endotracheal anesthesia  Findings: The uterus was sounded to 8 cm Intracavitary mass noted slightly beyond the level of internal cervical os, smooth in appearance. Tubal ostia visualized bilaterally after removal of intracavitary mass. Uterine cavity otherwise unremarkable. Luteal phase tissue noted as patient currently on menses.   Procedure Details: The patient was seen in the Holding Room. The risks, benefits, complications, treatment options, and expected outcomes were discussed with the patient.  The patient concurred with the proposed plan, giving informed consent.  The site of surgery properly noted/marked. The patient was taken to the Operating Room, identified as Dorthey Sawyer and the procedure verified as Procedure(s) (LRB): HYSTEROSCOPIC MYOMECTOMY (N/A). A Time Out was held and the above information confirmed.  The patient was then placed under general anesthesia without difficulty.  She was then prepped and draped in the normal sterile fashion, and placed in the dorsal lithotomy position.  A time out was performed.  An exam under anesthesia was performed with the findings noted above.  Straight catheterization was performed. A sterile speculum was inserted into vagina. A single-tooth tenaculum was used to grasp the anterior lip of the cervix. The uterus was sounded  to 8 cm. Cervical dilation was performed but was already noted to be adequately dilated. A 5 mm Fluent hysteroscope was introduced into the uterus under direct visualization. The cavity was allowed to fill, and then the entire cavity was explored with the findings described above. The Symphion device was then used to resect the intracavitary mass (polyp vs remaining fibroid stalk) to its entirety. The Symphion device was then removed, and the hysteroscope was then used to perform a final survey, uterine cavity appeared intact.  The hysteroscope was removed from the patient's uterine cavity. The tenaculum was removed and excellent hemostasis was noted.  The speculum was removed from the vagina. Two prominent fronds from the hymenal ring were noted at 12 and 5 o'clock. This tissue was excised with scissors and cauterized with silver nitrate stick.  The 5 o'clock area was still noted to be oozy so a figure-of-eight suture was placed with 4-0 Vicryl. Hemostasis was achieved.   All instrument and sponge counts were correct at the end of the procedure x 2.  The patient tolerated the procedure well.  She was awakened and taken to the PACU in stable condition.    Estimated Blood Loss:  8 ml      Drains: straight catheterization prior to procedure with 120 ml of clear urine         Total IV Fluids:   800 ml  Specimens: Endometrial mass (fibroid vs polyp)         Implants: None         Complications:  None; patient tolerated the procedure well.         Disposition: PACU - hemodynamically stable.         Condition: stable   Hildred Laser, MD [place] [place] [facility]

## 2022-06-26 NOTE — Transfer of Care (Signed)
Immediate Anesthesia Transfer of Care Note  Patient: Annette Leon  Procedure(s) Performed: HYSTEROSCOPIC MYOMECTOMY (Vagina )  Patient Location: PACU  Anesthesia Type:General  Level of Consciousness: awake and alert   Airway & Oxygen Therapy: Patient Spontanous Breathing and Patient connected to face mask  Post-op Assessment: Report given to RN and Post -op Vital signs reviewed and stable  Post vital signs: Reviewed and stable  Last Vitals:  Vitals Value Taken Time  BP 113/87 06/26/22 1045  Temp 36.1 C 06/26/22 1038  Pulse 75 06/26/22 1049  Resp 15 06/26/22 1049  SpO2 97 % 06/26/22 1049  Vitals shown include unvalidated device data.  Last Pain:  Vitals:   06/26/22 0805  PainSc: 0-No pain         Complications: No notable events documented.

## 2022-06-26 NOTE — Anesthesia Preprocedure Evaluation (Signed)
Anesthesia Evaluation  Patient identified by MRN, date of birth, ID band Patient awake    Reviewed: Allergy & Precautions, H&P , NPO status , Patient's Chart, lab work & pertinent test results, reviewed documented beta blocker date and time   History of Anesthesia Complications (+) PONV and history of anesthetic complications  Airway Mallampati: II  TM Distance: >3 FB Neck ROM: full    Dental  (+) Teeth Intact   Pulmonary neg pulmonary ROS   Pulmonary exam normal        Cardiovascular Exercise Tolerance: Good hypertension, On Medications negative cardio ROS Normal cardiovascular exam Rhythm:regular Rate:Normal     Neuro/Psych   Anxiety     negative neurological ROS  negative psych ROS   GI/Hepatic negative GI ROS, Neg liver ROS,,,  Endo/Other  negative endocrine ROSdiabetes, Well Controlled, Type 2, Oral Hypoglycemic Agents    Renal/GU negative Renal ROS  negative genitourinary   Musculoskeletal   Abdominal   Peds  Hematology negative hematology ROS (+)   Anesthesia Other Findings Past Medical History: No date: Anxiety No date: HLD (hyperlipidemia) No date: Hypertension No date: Mononucleosis No date: PONV (postoperative nausea and vomiting) 08/2021: Splenic infarct 05/23/2016: Type 2 diabetes mellitus (HCC) 04/2022: Uterine fibroid Past Surgical History: No date: CYSTECTOMY     Comment:  BL fallopian tube cysts 07/13/2014: ESOPHAGOGASTRODUODENOSCOPY; N/A     Comment:  Procedure: ESOPHAGOGASTRODUODENOSCOPY (EGD);  Surgeon:               Wallace Cullens, MD;  Location: [facility] ENDOSCOPY;  Service:               Gastroenterology;  Laterality: N/A; 05/22/2022: HYSTEROSCOPY WITH D & C; N/A     Comment:  Procedure: HYSTEROSCOPY D&C;  Surgeon: Hildred Laser,               MD;  Location: [facility] ORS;  Service: Gynecology;                Laterality: N/A; 05/22/2022: MYOMECTOMY; N/A     Comment:  Procedure: VAGINAL  MYOMECTOMY;  Surgeon: Hildred Laser,               MD;  Location: [facility] ORS;  Service: Gynecology;                Laterality: N/A; No date: SALPINGECTOMY     Comment:  partial BMI    Body Mass Index: 36.49 kg/m     Reproductive/Obstetrics negative OB ROS                             Anesthesia Physical Anesthesia Plan  ASA: 2  Anesthesia Plan: General ETT   Post-op Pain Management:    Induction:   PONV Risk Score and Plan: 4 or greater and Ondansetron, Dexamethasone, Propofol infusion, TIVA and Midazolam  Airway Management Planned:   Additional Equipment:   Intra-op Plan:   Post-operative Plan:   Informed Consent: I have reviewed the patients History and Physical, chart, labs and discussed the procedure including the risks, benefits and alternatives for the proposed anesthesia with the patient or authorized representative who has indicated his/her understanding and acceptance.     Dental Advisory Given  Plan Discussed with: CRNA  Anesthesia Plan Comments:        Anesthesia Quick Evaluation

## 2022-06-26 NOTE — Anesthesia Procedure Notes (Signed)
Procedure Name: Intubation Date/Time: 06/26/2022 9:34 AM  Performed by: Danelle Berry, CRNAPre-anesthesia Checklist: Patient identified, Emergency Drugs available, Suction available, Patient being monitored and Timeout performed Patient Re-evaluated:Patient Re-evaluated prior to induction Oxygen Delivery Method: Circle system utilized and Simple face mask Preoxygenation: Pre-oxygenation with 100% oxygen Induction Type: IV induction Laryngoscope Size: McGraph and 3 Tube type: Oral Tube size: 7.0 mm Airway Equipment and Method: Stylet Placement Confirmation: ETT inserted through vocal cords under direct vision, positive ETCO2 and breath sounds checked- equal and bilateral Secured at: 20 cm Tube secured with: Tape

## 2022-06-26 NOTE — Interval H&P Note (Signed)
History and Physical Interval Note:  06/26/2022 8:48 AM  Annette Leon  has presented today for surgery, with the diagnosis of Uterine leiomyoma-Pedunculated intracavitary.  The various methods of treatment have been discussed with the patient and family. After consideration of risks, benefits and other options for treatment, the patient has consented to  Procedure(s): HYSTEROSCOPIC MYOMECTOMY (N/A) as a surgical intervention.  The patient's history has been reviewed, patient examined, no change in status, stable for surgery.  I have reviewed the patient's chart and labs.  Questions were answered to the patient's satisfaction.     Hildred Laser, MD [place] [facility] at [facility]

## 2022-06-26 NOTE — Discharge Instructions (Signed)
AMBULATORY SURGERY  ?DISCHARGE INSTRUCTIONS ? ? ?The drugs that you were given will stay in your system until tomorrow so for the next 24 hours you should not: ? ?Drive an automobile ?Make any legal decisions ?Drink any alcoholic beverage ? ? ?You may resume regular meals tomorrow.  Today it is better to start with liquids and gradually work up to solid foods. ? ?You may eat anything you prefer, but it is better to start with liquids, then soup and crackers, and gradually work up to solid foods. ? ? ?Please notify your doctor immediately if you have any unusual bleeding, trouble breathing, redness and pain at the surgery site, drainage, fever, or pain not relieved by medication. ? ? ? ?Additional Instructions: ? ? ? ?Please contact your physician with any problems or Same Day Surgery at [phone], Monday through Friday 6 am to 4 pm, or [place] [facility] [place] [facility] number at [phone].  ?

## 2022-06-27 ENCOUNTER — Encounter: Payer: Self-pay | Admitting: Obstetrics and Gynecology

## 2022-06-27 LAB — SURGICAL PATHOLOGY

## 2022-07-12 ENCOUNTER — Encounter: Payer: BC Managed Care – PPO | Admitting: Obstetrics and Gynecology

## 2022-07-25 ENCOUNTER — Encounter: Payer: Self-pay | Admitting: Obstetrics and Gynecology

## 2022-08-02 NOTE — Progress Notes (Deleted)
    OBSTETRICS/GYNECOLOGY POST-OPERATIVE CLINIC VISIT  Subjective:     Annette Leon is a 32 y.o. female who presents to the clinic 6 weeks status post HYSTEROSCOPIC MYOMECTOMY for Uterine leiomyoma-Pedunculated intracavitary . Eating a regular diet {with-without:5700} difficulty. Bowel movements are {normal/abnormal***:19619}. {pain control:13522::"The patient is not having any pain."}  {Common ambulatory SmartLinks:19316}  Review of Systems {ros; complete:30496}   Objective:   There were no vitals taken for this visit. There is no height or weight on file to calculate BMI.  General:  alert and no distress  Abdomen: soft, bowel sounds active, non-tender  Incision:   {incision:13716::"no dehiscence","incision well approximated","healing well","no drainage","no erythema","no hernia","no seroma","no swelling"}    Pathology:    Assessment:   Patient s/p HYSTEROSCOPIC MYOMECTOMY (surgery)  {doing well:13525::"Doing well postoperatively."}   Plan:   1. Continue any current medications as instructed by provider. 2. Wound care discussed. 3. Operative findings again reviewed. Pathology report discussed. 4. Activity restrictions: {restrictions:13723} 5. Anticipated return to work: {work return:14002}. 6. Follow up: {3-47:42595} {time; units:18646} for ***    Hildred Laser, MD [facility]

## 2022-08-04 ENCOUNTER — Encounter: Payer: BC Managed Care – PPO | Admitting: Obstetrics and Gynecology

## 2022-09-13 ENCOUNTER — Ambulatory Visit (INDEPENDENT_AMBULATORY_CARE_PROVIDER_SITE_OTHER): Payer: BC Managed Care – PPO | Admitting: Obstetrics and Gynecology

## 2022-09-13 ENCOUNTER — Encounter: Payer: Self-pay | Admitting: Obstetrics and Gynecology

## 2022-09-13 VITALS — BP 145/96 | HR 89 | Resp 16 | Ht 68.5 in | Wt 246.9 lb

## 2022-09-13 DIAGNOSIS — Z8742 Personal history of other diseases of the female genital tract: Secondary | ICD-10-CM | POA: Diagnosis not present

## 2022-09-13 DIAGNOSIS — Z4889 Encounter for other specified surgical aftercare: Secondary | ICD-10-CM

## 2022-09-13 DIAGNOSIS — Z9889 Other specified postprocedural states: Secondary | ICD-10-CM
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
## 2023-10-30 ENCOUNTER — Encounter: Payer: Self-pay | Admitting: Obstetrics and Gynecology

## 2023-10-30 ENCOUNTER — Ambulatory Visit (INDEPENDENT_AMBULATORY_CARE_PROVIDER_SITE_OTHER): Admitting: Obstetrics and Gynecology

## 2023-10-30 VITALS — BP 126/87 | HR 86 | Ht 68.5 in | Wt 258.0 lb

## 2023-10-30 DIAGNOSIS — Z3169 Encounter for other general counseling and advice on procreation: Secondary | ICD-10-CM | POA: Diagnosis not present

## 2023-10-30 DIAGNOSIS — Z01411 Encounter for gynecological examination (general) (routine) with abnormal findings: Secondary | ICD-10-CM | POA: Diagnosis not present

## 2023-10-30 DIAGNOSIS — Z01419 Encounter for gynecological examination (general) (routine) without abnormal findings: Secondary | ICD-10-CM

## 2023-10-30 DIAGNOSIS — D259 Leiomyoma of uterus, unspecified: Secondary | ICD-10-CM

## 2023-10-30 DIAGNOSIS — R399 Unspecified symptoms and signs involving the genitourinary system: Secondary | ICD-10-CM

## 2023-10-30 DIAGNOSIS — D219 Benign neoplasm of connective and other soft tissue, unspecified: Secondary | ICD-10-CM

## 2023-10-30 LAB — POCT URINALYSIS DIPSTICK
Bilirubin, UA: NEGATIVE
Blood, UA: NEGATIVE
Glucose, UA: NEGATIVE
Ketones, UA: NEGATIVE
Nitrite, UA: NEGATIVE
Protein, UA: NEGATIVE
Spec Grav, UA: 1.015 (ref 1.010–1.025)
pH, UA: 5 (ref 5.0–8.0)

## 2023-10-30 MED ORDER — CIPROFLOXACIN HCL 500 MG PO TABS
500.0000 mg | ORAL_TABLET | Freq: Two times a day (BID) | ORAL | 0 refills | Status: AC
Start: 2023-10-30 — End: ?

## 2023-10-30 NOTE — Patient Instructions (Signed)
 I value your feedback and you entrusting [place] with your care. If you get a [place] patient survey, I would appreciate you taking the time to let us know about your experience today. Thank you! ? ? ?

## 2023-11-01 ENCOUNTER — Ambulatory Visit: Payer: Self-pay | Admitting: Obstetrics and Gynecology

## 2023-11-01 LAB — URINE CULTURE

## 2023-11-04 ENCOUNTER — Emergency Department

## 2023-11-04 ENCOUNTER — Other Ambulatory Visit: Payer: Self-pay

## 2023-11-04 ENCOUNTER — Emergency Department
Admission: EM | Admit: 2023-11-04 | Discharge: 2023-11-05 | Disposition: A | Discharge status: Home / Self Care | Attending: Emergency Medicine | Admitting: Emergency Medicine

## 2023-11-04 DIAGNOSIS — M7918 Myalgia, other site: Secondary | ICD-10-CM

## 2023-11-04 DIAGNOSIS — M79652 Pain in left thigh: Secondary | ICD-10-CM | POA: Insufficient documentation

## 2023-11-04 DIAGNOSIS — Y92410 Unspecified street and highway as the place of occurrence of the external cause: Secondary | ICD-10-CM | POA: Diagnosis not present

## 2023-11-04 DIAGNOSIS — S0990XA Unspecified injury of head, initial encounter: Secondary | ICD-10-CM | POA: Diagnosis present

## 2023-11-04 DIAGNOSIS — R109 Unspecified abdominal pain: Secondary | ICD-10-CM | POA: Insufficient documentation

## 2023-11-04 DIAGNOSIS — I10 Essential (primary) hypertension: Secondary | ICD-10-CM | POA: Diagnosis not present

## 2023-11-04 DIAGNOSIS — M79602 Pain in left arm: Secondary | ICD-10-CM | POA: Diagnosis not present

## 2023-11-04 DIAGNOSIS — E119 Type 2 diabetes mellitus without complications: Secondary | ICD-10-CM | POA: Insufficient documentation

## 2023-11-04 MED ORDER — ONDANSETRON HCL 4 MG/2ML IJ SOLN
4.0000 mg | Freq: Once | INTRAMUSCULAR | Status: AC
  Administered 2023-11-04: 4 mg via INTRAVENOUS
  Filled 2023-11-04: qty 2

## 2023-11-04 MED ORDER — KETOROLAC TROMETHAMINE 15 MG/ML IJ SOLN
15.0000 mg | Freq: Once | INTRAMUSCULAR | Status: AC
  Administered 2023-11-04: 15 mg via INTRAVENOUS
  Filled 2023-11-04: qty 1

## 2023-11-04 MED ORDER — ONDANSETRON 4 MG PO TBDP: 4.0000 mg | ORAL_TABLET | Freq: Once | ORAL | Status: DC

## 2023-11-04 MED ORDER — METOCLOPRAMIDE HCL 5 MG/ML IJ SOLN
10.0000 mg | Freq: Once | INTRAMUSCULAR | Status: AC
  Administered 2023-11-04: 10 mg via INTRAVENOUS
  Filled 2023-11-04: qty 2

## 2023-11-04 MED ORDER — MORPHINE SULFATE (PF) 4 MG/ML IV SOLN: 4.0000 mg | Freq: Once | INTRAVENOUS | Status: DC

## 2023-11-04 MED ORDER — PROMETHAZINE HCL 25 MG PO TABS: 25.0000 mg | ORAL_TABLET | Freq: Four times a day (QID) | ORAL | 0 refills | Status: AC | PRN
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
## 2023-11-05 NOTE — Discharge Instructions (Signed)
 The CT scan of your head did not show any acute abnormalities.  You will likely be more sore tomorrow than you are today, this is normal. After this your pain should slowly be improving. If not, you need to be seen by another health care provider. This could be your PCP, urgent care or in the emergency department. Return to the emergency department specifically, if you have development of chest pain, shortness of breath, abdominal pain, new abdominal bruising or any other symptom personally concerning to you.  You can take 650 mg of Tylenol  and 600 mg of ibuprofen  every 6 hours as needed for pain. You can use ice, heat, muscle creams and other topical pain relievers as well.  You may have a concussion. Symptoms of concussion include headache, nausea, vomiting, dizziness, blurry vision, difficulty sleeping, difficulty concentrating, light sensitivity, sound sensitivity, mood swings and confusion. These symptoms typically resolve in 7-10 days. If they last for more than 2 weeks please be reevaluated by another healthcare provider.  Return to the emergency department with any worsening symptoms.
